# Patient Record
Sex: Female | Born: 1960
Health system: Southern US, Community
[De-identification: ages and names within clinical notes are randomized; demographics above are authoritative.]

## PROBLEM LIST (undated history)

## (undated) DIAGNOSIS — D649 Anemia, unspecified: Secondary | ICD-10-CM

## (undated) DIAGNOSIS — F909 Attention-deficit hyperactivity disorder, unspecified type: Secondary | ICD-10-CM

## (undated) DIAGNOSIS — F419 Anxiety disorder, unspecified: Secondary | ICD-10-CM

## (undated) DIAGNOSIS — F329 Major depressive disorder, single episode, unspecified: Secondary | ICD-10-CM

## (undated) DIAGNOSIS — N840 Polyp of corpus uteri: Secondary | ICD-10-CM

## (undated) DIAGNOSIS — G43909 Migraine, unspecified, not intractable, without status migrainosus: Secondary | ICD-10-CM

## (undated) DIAGNOSIS — F32A Depression, unspecified: Secondary | ICD-10-CM

## (undated) HISTORY — PX: ABDOMINAL HYSTERECTOMY: SHX81

## (undated) HISTORY — PX: UMBILICAL HERNIA REPAIR: SHX196

## (undated) HISTORY — PX: LAPAROSCOPIC VAGINAL HYSTERECTOMY WITH SALPINGO OOPHORECTOMY: SHX6681

---
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
## 1998-01-17 ENCOUNTER — Other Ambulatory Visit: Admission: RE | Admit: 1998-01-17 | Discharge: 1998-01-17 | Payer: Self-pay | Admitting: *Deleted

## 1998-09-30 ENCOUNTER — Encounter: Payer: Self-pay | Admitting: Emergency Medicine

## 1998-09-30 ENCOUNTER — Emergency Department (HOSPITAL_COMMUNITY): Admission: EM | Admit: 1998-09-30 | Discharge: 1998-09-30 | Payer: Self-pay | Admitting: Emergency Medicine

## 1999-01-20 ENCOUNTER — Other Ambulatory Visit: Admission: RE | Admit: 1999-01-20 | Discharge: 1999-01-20 | Payer: Self-pay | Admitting: *Deleted

## 1999-03-03 ENCOUNTER — Other Ambulatory Visit: Admission: RE | Admit: 1999-03-03 | Discharge: 1999-03-03 | Payer: Self-pay | Admitting: Obstetrics and Gynecology

## 1999-03-19 ENCOUNTER — Ambulatory Visit (HOSPITAL_COMMUNITY): Admission: RE | Admit: 1999-03-19 | Discharge: 1999-03-19 | Payer: Self-pay | Admitting: *Deleted

## 1999-03-31 ENCOUNTER — Other Ambulatory Visit: Admission: RE | Admit: 1999-03-31 | Discharge: 1999-03-31 | Payer: Self-pay | Admitting: Obstetrics and Gynecology

## 1999-03-31 ENCOUNTER — Encounter (INDEPENDENT_AMBULATORY_CARE_PROVIDER_SITE_OTHER): Payer: Self-pay | Admitting: Specialist

## 1999-10-06 ENCOUNTER — Other Ambulatory Visit: Admission: RE | Admit: 1999-10-06 | Discharge: 1999-10-06 | Payer: Self-pay | Admitting: Obstetrics and Gynecology

## 1999-10-07 ENCOUNTER — Encounter (INDEPENDENT_AMBULATORY_CARE_PROVIDER_SITE_OTHER): Payer: Self-pay

## 1999-10-07 ENCOUNTER — Other Ambulatory Visit: Admission: RE | Admit: 1999-10-07 | Discharge: 1999-10-07 | Payer: Self-pay | Admitting: Obstetrics and Gynecology

## 2000-01-08 ENCOUNTER — Encounter (INDEPENDENT_AMBULATORY_CARE_PROVIDER_SITE_OTHER): Payer: Self-pay | Admitting: Specialist

## 2000-01-08 ENCOUNTER — Ambulatory Visit (HOSPITAL_COMMUNITY): Admission: RE | Admit: 2000-01-08 | Discharge: 2000-01-08 | Payer: Self-pay | Admitting: Obstetrics and Gynecology

## 2000-06-03 ENCOUNTER — Other Ambulatory Visit: Admission: RE | Admit: 2000-06-03 | Discharge: 2000-06-03 | Payer: Self-pay | Admitting: Obstetrics and Gynecology

## 2000-06-09 ENCOUNTER — Encounter: Payer: Self-pay | Admitting: Obstetrics and Gynecology

## 2000-06-09 ENCOUNTER — Encounter: Admission: RE | Admit: 2000-06-09 | Discharge: 2000-06-09 | Payer: Self-pay | Admitting: Obstetrics and Gynecology

## 2001-03-10 ENCOUNTER — Other Ambulatory Visit: Admission: RE | Admit: 2001-03-10 | Discharge: 2001-03-10 | Payer: Self-pay | Admitting: Obstetrics and Gynecology

## 2001-04-14 ENCOUNTER — Ambulatory Visit (HOSPITAL_COMMUNITY): Admission: RE | Admit: 2001-04-14 | Discharge: 2001-04-14 | Payer: Self-pay | Admitting: Internal Medicine

## 2001-04-14 ENCOUNTER — Encounter: Payer: Self-pay | Admitting: Internal Medicine

## 2002-03-20 ENCOUNTER — Other Ambulatory Visit: Admission: RE | Admit: 2002-03-20 | Discharge: 2002-03-20 | Payer: Self-pay | Admitting: Obstetrics and Gynecology

## 2002-12-24 ENCOUNTER — Encounter: Payer: Self-pay | Admitting: Emergency Medicine

## 2002-12-24 ENCOUNTER — Emergency Department (HOSPITAL_COMMUNITY): Admission: EM | Admit: 2002-12-24 | Discharge: 2002-12-24 | Payer: Self-pay | Admitting: Emergency Medicine

## 2003-04-10 ENCOUNTER — Other Ambulatory Visit: Admission: RE | Admit: 2003-04-10 | Discharge: 2003-04-10 | Payer: Self-pay | Admitting: Obstetrics and Gynecology

## 2003-08-22 ENCOUNTER — Encounter: Admission: RE | Admit: 2003-08-22 | Discharge: 2003-08-22 | Payer: Self-pay | Admitting: Family Medicine

## 2004-04-04 ENCOUNTER — Inpatient Hospital Stay (HOSPITAL_COMMUNITY): Admission: AD | Admit: 2004-04-04 | Discharge: 2004-04-04 | Payer: Self-pay | Admitting: Obstetrics & Gynecology

## 2004-04-08 ENCOUNTER — Encounter: Admission: RE | Admit: 2004-04-08 | Discharge: 2004-04-08 | Payer: Self-pay | Admitting: Endocrinology

## 2004-05-20 ENCOUNTER — Encounter: Admission: RE | Admit: 2004-05-20 | Discharge: 2004-05-20 | Payer: Self-pay | Admitting: *Deleted

## 2004-08-01 ENCOUNTER — Encounter: Admission: RE | Admit: 2004-08-01 | Discharge: 2004-08-01 | Payer: Self-pay | Admitting: Obstetrics and Gynecology

## 2005-12-09 ENCOUNTER — Encounter: Admission: RE | Admit: 2005-12-09 | Discharge: 2005-12-09 | Payer: Self-pay | Admitting: Obstetrics and Gynecology

## 2005-12-20 ENCOUNTER — Emergency Department (HOSPITAL_COMMUNITY): Admission: EM | Admit: 2005-12-20 | Discharge: 2005-12-20 | Payer: Self-pay | Admitting: Emergency Medicine

## 2006-09-11 ENCOUNTER — Encounter: Admission: RE | Admit: 2006-09-11 | Discharge: 2006-09-11 | Payer: Self-pay

## 2007-01-06 ENCOUNTER — Encounter: Admission: RE | Admit: 2007-01-06 | Discharge: 2007-01-06 | Payer: Self-pay | Admitting: *Deleted

## 2007-06-20 ENCOUNTER — Encounter: Admission: RE | Admit: 2007-06-20 | Discharge: 2007-06-20 | Payer: Self-pay | Admitting: Obstetrics and Gynecology

## 2008-02-13 ENCOUNTER — Emergency Department (HOSPITAL_COMMUNITY): Admission: EM | Admit: 2008-02-13 | Discharge: 2008-02-13 | Payer: Self-pay | Admitting: Emergency Medicine

## 2008-06-07 ENCOUNTER — Encounter: Admission: RE | Admit: 2008-06-07 | Discharge: 2008-06-07 | Payer: Self-pay | Admitting: Obstetrics and Gynecology

## 2009-04-30 ENCOUNTER — Encounter: Admission: RE | Admit: 2009-04-30 | Discharge: 2009-04-30 | Payer: Self-pay | Admitting: Obstetrics

## 2009-08-21 ENCOUNTER — Encounter: Admission: RE | Admit: 2009-08-21 | Discharge: 2009-08-21 | Payer: Self-pay | Admitting: Obstetrics

## 2010-05-06 ENCOUNTER — Ambulatory Visit: Payer: Self-pay | Admitting: Genetic Counselor

## 2010-08-24 ENCOUNTER — Encounter: Payer: Self-pay | Admitting: Obstetrics and Gynecology

## 2010-08-24 ENCOUNTER — Encounter: Payer: Self-pay | Admitting: *Deleted

## 2010-08-29 ENCOUNTER — Encounter
Admission: RE | Admit: 2010-08-29 | Discharge: 2010-08-29 | Payer: Self-pay | Source: Home / Self Care | Attending: Obstetrics | Admitting: Obstetrics

## 2010-12-19 NOTE — Op Note (Signed)
[facility]  Patient:    Molly Brady, Molly Brady                         MRN: [id number] Proc. Date: 01/08/00 Adm. Date:  [id number] Disc. Date: [id number] Attending:  Cordelia Pen Ii                           Operative Report  PREOPERATIVE DIAGNOSIS:  Abnormal uterine bleeding.  POSTOPERATIVE DIAGNOSIS:  Abnormal uterine bleeding.  PROCEDURE:  Hysteroscopy with dilatation and curettage.  ANESTHESIA:  MAC with 1% Xylocaine paracervical block.  ESTIMATED BLOOD LOSS:  Less than 50 cc.  INTAKE AND OUTPUT:  Sorbitol distending media 100 cc deficit with some of that on the floor.  INDICATIONS AND CONSENT:  The patient is a 50 year old divorced white female G1, P1, with increasingly heavy menses.  Sonohysterogram was suspicious for endometrial polyp.  Hysteroscopy and D&C is discussed with the patient. Possible risks and complications are discussed, including but not limited to infection, uterine perforation, organ damage, bleeding requiring transfusion of blood products with possible transfusion reaction, HIV, and hepatitis acquisition, DVT, PE, pneumonia, and hysterectomy.  All questions are answered and consent is signed on the chart.  DESCRIPTION OF PROCEDURE:  The patient was taken to the operating room and placed in the dorsal supine position, where she was given intravenous sedation.  She was then placed in the dorsal lithotomy position, where she was gently prepped, bladder straight catheterized in a straight sterile fashion. Bivalve speculum was placed in the vagina.  The anterior cervical lip is injected with 1% Xylocaine and then grasped with a single-tooth tenaculum. Paracervical block is then placed at 2, 4, 5, 7, 8, and 10 oclock positions with 1% Xylocaine.  The cervix is gently progressively dilated with Shawnie Pons dilators.  There is a sharp angle from the endocervix into the endometrial cavity.  The endocervix is dilated to a 33 Pratt dilator,  and the diagnostic hysteroscope is still unable to be placed through the endocervix into the endometrial cavity safely.  This is done under direct visualization using Sorbitol distending media.  At this point, it is felt that the hysteroscope could not be safely placed in the endometrial cavity.  Therefore, sharp curettage is done.  This is productive of obvious endometrial tissue.  Sharp curettage is then carried out until the cavity feels clean.  The cavity is explored with Randall stone forceps.  The single-tooth tenaculum is removed, and good hemostasis is noted.  All instruments are removed, and all counts are correct, and the patient transferred to the recovery room in stable condition. DD:  01/08/00 TD:  01/12/00 Job: [id number]

## 2011-11-10 ENCOUNTER — Other Ambulatory Visit: Payer: Self-pay | Admitting: Obstetrics

## 2011-11-10 DIAGNOSIS — Z1231 Encounter for screening mammogram for malignant neoplasm of breast: Secondary | ICD-10-CM

## 2011-11-20 ENCOUNTER — Ambulatory Visit
Admission: RE | Admit: 2011-11-20 | Discharge: 2011-11-20 | Disposition: A | Discharge status: Home / Self Care | Payer: BC Managed Care – PPO | Source: Ambulatory Visit | Attending: Obstetrics | Admitting: Obstetrics

## 2011-11-20 DIAGNOSIS — Z1231 Encounter for screening mammogram for malignant neoplasm of breast: Secondary | ICD-10-CM

## 2013-02-07 ENCOUNTER — Other Ambulatory Visit: Payer: Self-pay

## 2013-02-07 DIAGNOSIS — Z1231 Encounter for screening mammogram for malignant neoplasm of breast: Secondary | ICD-10-CM

## 2013-03-02 ENCOUNTER — Ambulatory Visit
Admission: RE | Admit: 2013-03-02 | Discharge: 2013-03-02 | Disposition: A | Discharge status: Home / Self Care | Payer: Managed Care, Other (non HMO) | Source: Ambulatory Visit

## 2013-03-02 DIAGNOSIS — Z1231 Encounter for screening mammogram for malignant neoplasm of breast: Secondary | ICD-10-CM

## 2014-03-28 ENCOUNTER — Other Ambulatory Visit: Payer: Self-pay

## 2014-03-28 DIAGNOSIS — Z1231 Encounter for screening mammogram for malignant neoplasm of breast: Secondary | ICD-10-CM

## 2014-04-12 ENCOUNTER — Ambulatory Visit
Admission: RE | Admit: 2014-04-12 | Discharge: 2014-04-12 | Disposition: A | Discharge status: Home / Self Care | Payer: 59 | Source: Ambulatory Visit

## 2014-04-12 DIAGNOSIS — Z1231 Encounter for screening mammogram for malignant neoplasm of breast: Secondary | ICD-10-CM

## 2015-04-04 ENCOUNTER — Other Ambulatory Visit: Payer: Self-pay

## 2015-04-04 DIAGNOSIS — Z1231 Encounter for screening mammogram for malignant neoplasm of breast: Secondary | ICD-10-CM

## 2015-04-17 ENCOUNTER — Ambulatory Visit
Admission: RE | Admit: 2015-04-17 | Discharge: 2015-04-17 | Disposition: A | Discharge status: Home / Self Care | Payer: 59 | Source: Ambulatory Visit

## 2015-04-17 DIAGNOSIS — Z1231 Encounter for screening mammogram for malignant neoplasm of breast: Secondary | ICD-10-CM

## 2016-04-01 ENCOUNTER — Other Ambulatory Visit: Payer: Self-pay | Admitting: Obstetrics

## 2016-04-01 DIAGNOSIS — Z1231 Encounter for screening mammogram for malignant neoplasm of breast: Secondary | ICD-10-CM

## 2016-04-20 ENCOUNTER — Ambulatory Visit
Admission: RE | Admit: 2016-04-20 | Discharge: 2016-04-20 | Disposition: A | Discharge status: Home / Self Care | Payer: 59 | Source: Ambulatory Visit | Attending: Obstetrics | Admitting: Obstetrics

## 2016-04-20 DIAGNOSIS — Z1231 Encounter for screening mammogram for malignant neoplasm of breast: Secondary | ICD-10-CM

## 2016-04-23 ENCOUNTER — Other Ambulatory Visit: Payer: Self-pay | Admitting: Obstetrics

## 2016-04-23 DIAGNOSIS — R928 Other abnormal and inconclusive findings on diagnostic imaging of breast: Secondary | ICD-10-CM

## 2016-04-27 ENCOUNTER — Ambulatory Visit
Admission: RE | Admit: 2016-04-27 | Discharge: 2016-04-27 | Disposition: A | Discharge status: Home / Self Care | Payer: 59 | Source: Ambulatory Visit | Attending: Obstetrics | Admitting: Obstetrics

## 2016-04-27 ENCOUNTER — Other Ambulatory Visit: Payer: Self-pay | Admitting: Obstetrics

## 2016-04-27 DIAGNOSIS — N6002 Solitary cyst of left breast: Secondary | ICD-10-CM

## 2016-04-27 DIAGNOSIS — R928 Other abnormal and inconclusive findings on diagnostic imaging of breast: Secondary | ICD-10-CM

## 2016-04-28 ENCOUNTER — Other Ambulatory Visit: Payer: 59

## 2016-04-29 ENCOUNTER — Ambulatory Visit
Admission: RE | Admit: 2016-04-29 | Discharge: 2016-04-29 | Disposition: A | Discharge status: Home / Self Care | Payer: 59 | Source: Ambulatory Visit | Attending: Obstetrics | Admitting: Obstetrics

## 2016-04-29 ENCOUNTER — Other Ambulatory Visit: Payer: Self-pay | Admitting: Obstetrics

## 2016-04-29 DIAGNOSIS — N6002 Solitary cyst of left breast: Secondary | ICD-10-CM

## 2016-08-13 DIAGNOSIS — D352 Benign neoplasm of pituitary gland: Secondary | ICD-10-CM | POA: Diagnosis not present

## 2016-12-20 DIAGNOSIS — H2000 Unspecified acute and subacute iridocyclitis: Secondary | ICD-10-CM | POA: Diagnosis not present

## 2016-12-20 DIAGNOSIS — H1820 Unspecified corneal edema: Secondary | ICD-10-CM | POA: Diagnosis not present

## 2016-12-20 DIAGNOSIS — H18821 Corneal disorder due to contact lens, right eye: Secondary | ICD-10-CM | POA: Diagnosis not present

## 2016-12-21 DIAGNOSIS — H2000 Unspecified acute and subacute iridocyclitis: Secondary | ICD-10-CM | POA: Diagnosis not present

## 2016-12-23 DIAGNOSIS — Z6823 Body mass index (BMI) 23.0-23.9, adult: Secondary | ICD-10-CM | POA: Diagnosis not present

## 2016-12-23 DIAGNOSIS — Z01419 Encounter for gynecological examination (general) (routine) without abnormal findings: Secondary | ICD-10-CM | POA: Diagnosis not present

## 2016-12-24 DIAGNOSIS — H2000 Unspecified acute and subacute iridocyclitis: Secondary | ICD-10-CM | POA: Diagnosis not present

## 2017-01-07 DIAGNOSIS — H2000 Unspecified acute and subacute iridocyclitis: Secondary | ICD-10-CM | POA: Diagnosis not present

## 2017-01-29 DIAGNOSIS — Z23 Encounter for immunization: Secondary | ICD-10-CM | POA: Diagnosis not present

## 2017-08-03 DIAGNOSIS — R748 Abnormal levels of other serum enzymes: Secondary | ICD-10-CM

## 2017-08-03 HISTORY — DX: Abnormal levels of other serum enzymes: R74.8

## 2017-09-13 DIAGNOSIS — J019 Acute sinusitis, unspecified: Secondary | ICD-10-CM | POA: Diagnosis not present

## 2017-09-15 ENCOUNTER — Encounter: Payer: Self-pay | Admitting: Internal Medicine

## 2017-10-26 ENCOUNTER — Other Ambulatory Visit: Payer: Self-pay | Admitting: Internal Medicine

## 2017-10-26 DIAGNOSIS — Z1321 Encounter for screening for nutritional disorder: Secondary | ICD-10-CM

## 2017-10-26 DIAGNOSIS — Z1329 Encounter for screening for other suspected endocrine disorder: Secondary | ICD-10-CM

## 2017-10-26 DIAGNOSIS — Z13220 Encounter for screening for lipoid disorders: Secondary | ICD-10-CM

## 2017-10-26 DIAGNOSIS — Z0000 Encounter for general adult medical examination without abnormal findings: Secondary | ICD-10-CM

## 2017-11-02 ENCOUNTER — Other Ambulatory Visit: Payer: 59 | Admitting: Internal Medicine

## 2017-11-02 DIAGNOSIS — Z1321 Encounter for screening for nutritional disorder: Secondary | ICD-10-CM | POA: Diagnosis not present

## 2017-11-02 DIAGNOSIS — Z1329 Encounter for screening for other suspected endocrine disorder: Secondary | ICD-10-CM | POA: Diagnosis not present

## 2017-11-02 DIAGNOSIS — Z0000 Encounter for general adult medical examination without abnormal findings: Secondary | ICD-10-CM

## 2017-11-02 DIAGNOSIS — Z13220 Encounter for screening for lipoid disorders: Secondary | ICD-10-CM

## 2017-11-03 LAB — CBC WITH DIFFERENTIAL/PLATELET
BASOS ABS: 39 {cells}/uL (ref 0–200)
BASOS PCT: 0.8 %
Eosinophils Absolute: 39 cells/uL (ref 15–500)
Eosinophils Relative: 0.8 %
HEMATOCRIT: 39.5 % (ref 35.0–45.0)
HEMOGLOBIN: 13.3 g/dL (ref 11.7–15.5)
LYMPHS ABS: 2092 {cells}/uL (ref 850–3900)
MCH: 30.4 pg (ref 27.0–33.0)
MCHC: 33.7 g/dL (ref 32.0–36.0)
MCV: 90.4 fL (ref 80.0–100.0)
MPV: 9.4 fL (ref 7.5–12.5)
Monocytes Relative: 5.9 %
NEUTROS ABS: 2440 {cells}/uL (ref 1500–7800)
Neutrophils Relative %: 49.8 %
Platelets: 304 10*3/uL (ref 140–400)
RBC: 4.37 10*6/uL (ref 3.80–5.10)
RDW: 11.9 % (ref 11.0–15.0)
Total Lymphocyte: 42.7 %
WBC mixed population: 289 cells/uL (ref 200–950)
WBC: 4.9 10*3/uL (ref 3.8–10.8)

## 2017-11-03 LAB — COMPLETE METABOLIC PANEL WITH GFR
AG Ratio: 1.4 (calc) (ref 1.0–2.5)
ALT: 85 U/L — AB (ref 6–29)
AST: 59 U/L — AB (ref 10–35)
Albumin: 4.5 g/dL (ref 3.6–5.1)
Alkaline phosphatase (APISO): 181 U/L — ABNORMAL HIGH (ref 33–130)
BUN: 8 mg/dL (ref 7–25)
CALCIUM: 9.9 mg/dL (ref 8.6–10.4)
CHLORIDE: 101 mmol/L (ref 98–110)
CO2: 29 mmol/L (ref 20–32)
Creat: 0.75 mg/dL (ref 0.50–1.05)
GFR, EST AFRICAN AMERICAN: 104 mL/min/{1.73_m2} (ref >=60)
GFR, EST NON AFRICAN AMERICAN: 90 mL/min/{1.73_m2} (ref >=60)
GLUCOSE: 90 mg/dL (ref 65–99)
Globulin: 3.2 g/dL (calc) (ref 1.9–3.7)
Potassium: 4.3 mmol/L (ref 3.5–5.3)
Sodium: 140 mmol/L (ref 135–146)
Total Bilirubin: 0.4 mg/dL (ref 0.2–1.2)
Total Protein: 7.7 g/dL (ref 6.1–8.1)

## 2017-11-03 LAB — LIPID PANEL
CHOL/HDL RATIO: 3.3 (calc) (ref <5.0)
Cholesterol: 222 mg/dL — ABNORMAL HIGH (ref <200)
HDL: 68 mg/dL (ref >50)
LDL Cholesterol (Calc): 130 mg/dL (calc) — ABNORMAL HIGH
NON-HDL CHOLESTEROL (CALC): 154 mg/dL — AB (ref <130)
Triglycerides: 126 mg/dL (ref <150)

## 2017-11-03 LAB — VITAMIN D 25 HYDROXY (VIT D DEFICIENCY, FRACTURES): Vit D, 25-Hydroxy: 45 ng/mL (ref 30–100)

## 2017-11-03 LAB — TSH: TSH: 1 mIU/L

## 2017-11-09 ENCOUNTER — Ambulatory Visit (INDEPENDENT_AMBULATORY_CARE_PROVIDER_SITE_OTHER): Payer: 59 | Admitting: Internal Medicine

## 2017-11-09 ENCOUNTER — Encounter: Payer: Self-pay | Admitting: Internal Medicine

## 2017-11-09 VITALS — BP 120/90 | HR 90 | Ht 64.0 in | Wt 138.0 lb

## 2017-11-09 DIAGNOSIS — R748 Abnormal levels of other serum enzymes: Secondary | ICD-10-CM

## 2017-11-09 DIAGNOSIS — Z0000 Encounter for general adult medical examination without abnormal findings: Secondary | ICD-10-CM | POA: Diagnosis not present

## 2017-11-09 DIAGNOSIS — F329 Major depressive disorder, single episode, unspecified: Secondary | ICD-10-CM

## 2017-11-09 DIAGNOSIS — Z8669 Personal history of other diseases of the nervous system and sense organs: Secondary | ICD-10-CM

## 2017-11-09 DIAGNOSIS — E7800 Pure hypercholesterolemia, unspecified: Secondary | ICD-10-CM | POA: Diagnosis not present

## 2017-11-09 DIAGNOSIS — F900 Attention-deficit hyperactivity disorder, predominantly inattentive type: Secondary | ICD-10-CM | POA: Diagnosis not present

## 2017-11-09 DIAGNOSIS — F32A Depression, unspecified: Secondary | ICD-10-CM

## 2017-11-09 DIAGNOSIS — F419 Anxiety disorder, unspecified: Secondary | ICD-10-CM | POA: Diagnosis not present

## 2017-11-09 LAB — POCT URINALYSIS DIPSTICK
APPEARANCE: NORMAL
Bilirubin, UA: NEGATIVE
Blood, UA: NEGATIVE
Glucose, UA: NEGATIVE
Ketones, UA: NEGATIVE
Leukocytes, UA: NEGATIVE
NITRITE UA: NEGATIVE
Odor: NORMAL
PH UA: 6 (ref 5.0–8.0)
PROTEIN UA: NEGATIVE
Spec Grav, UA: 1.015 (ref 1.010–1.025)
UROBILINOGEN UA: 0.2 U/dL

## 2017-11-09 NOTE — Progress Notes (Signed)
   Subjective:    Patient ID: Molly Brady, female    DOB: Jun 19, 1961, 57 y.o.   MRN: [id number]  HPI First visit for this pleasant 57 year old White Female for health maintenance exam.  She sees Dr. Valentino Saxon for annual GYN exam.  She sees Dr. Lynder Parents for attention deficit disorder, anxiety, and depression.  Had umbilical hernia repair in 1963.  Right arm fracture twice in childhood.  Fractured foot from a fall downstairs and fractured ankle twice.  One pregnancy.  History of migraine headaches treated with Maxalt.  Patient takes Vyvanse, Adderall, gabapentin, Paxil and Xanax.  Social history: She is an Research scientist (life sciences) self-employed.  She is divorced.  Does not smoke or consume alcohol.  Family history: Father died of lung cancer at age 53.  Mother died of lung cancer at age 40.  2 sisters in good health.  Her daughter age 45 in good health.  One brother in good health.   Review of Systems menopausal at age 54,struggles with anxiety and depression     Objective:   Physical Exam  Constitutional: She is oriented to person, place, and time. She appears well-developed and well-nourished. No distress.  HENT:  Head: Normocephalic and atraumatic.  Right Ear: External ear normal.  Left Ear: External ear normal.  Mouth/Throat: Oropharynx is clear and moist.  Eyes: Pupils are equal, round, and reactive to light. Conjunctivae and EOM are normal. Right eye exhibits no discharge. Left eye exhibits no discharge. No scleral icterus.  Cardiovascular: Normal rate, regular rhythm and normal heart sounds.  Musculoskeletal: She exhibits no edema.  Neurological: She is oriented to person, place, and time. She has normal reflexes. No cranial nerve deficit. Coordination normal.  Skin: She is not diaphoretic.  Vitals reviewed.         Assessment & Plan:  Review of lab work shows elevated liver functions.  She does not consume alcohol.  No recent illness to explain it.  Alkaline phosphatase is  181, SGOT 59 and SGPT 85.  We have no previous liver functions to compare at this time so we are going to order ultrasound of liver and gallbladder for further evaluation.  Hyperlipidemia-total cholesterol was 222 with an LDL cholesterol of 130.  Recommend diet and exercise and follow-up in 6 months.  HDL is 68 and triglycerides are 126.

## 2017-11-11 ENCOUNTER — Telehealth: Payer: Self-pay | Admitting: Internal Medicine

## 2017-11-11 DIAGNOSIS — R748 Abnormal levels of other serum enzymes: Secondary | ICD-10-CM

## 2017-11-11 NOTE — Telephone Encounter (Signed)
Cancel liver ulrasound with doppler. Just need liver ultrasound due to elevated liver enzymes.

## 2017-11-16 ENCOUNTER — Telehealth: Payer: Self-pay | Admitting: Internal Medicine

## 2017-11-16 ENCOUNTER — Encounter: Payer: Self-pay | Admitting: Internal Medicine

## 2017-11-16 NOTE — Telephone Encounter (Signed)
Ultrasound of abdomen orderd for elevated LFTs

## 2017-11-19 ENCOUNTER — Ambulatory Visit
Admission: RE | Admit: 2017-11-19 | Discharge: 2017-11-19 | Disposition: A | Discharge status: Home / Self Care | Payer: 59 | Source: Ambulatory Visit | Attending: Internal Medicine | Admitting: Internal Medicine

## 2017-11-19 DIAGNOSIS — R7989 Other specified abnormal findings of blood chemistry: Secondary | ICD-10-CM | POA: Diagnosis not present

## 2017-11-19 DIAGNOSIS — R748 Abnormal levels of other serum enzymes: Secondary | ICD-10-CM

## 2017-11-23 ENCOUNTER — Telehealth: Payer: Self-pay | Admitting: Internal Medicine

## 2017-11-23 DIAGNOSIS — N949 Unspecified condition associated with female genital organs and menstrual cycle: Secondary | ICD-10-CM

## 2017-11-23 DIAGNOSIS — K760 Fatty (change of) liver, not elsewhere classified: Secondary | ICD-10-CM

## 2017-11-23 NOTE — Telephone Encounter (Signed)
Pt called about need for OV later this week. Had wanted to discuss results of abdominal ultrasound with her in office. Ultrasound was negative for gallstones. However, radiologist mentioned possible pelvic cyst as he noted dilated renal pelvis. This is not likely related to elevated LFTs. Still want to discuss this with her in office but have ordered CT of abdomen and pelvis with contrast for further evaluation.

## 2017-11-25 ENCOUNTER — Ambulatory Visit (INDEPENDENT_AMBULATORY_CARE_PROVIDER_SITE_OTHER): Payer: 59 | Admitting: Internal Medicine

## 2017-11-25 ENCOUNTER — Encounter: Payer: Self-pay | Admitting: Internal Medicine

## 2017-11-25 VITALS — BP 110/60 | HR 80 | Ht 64.0 in | Wt 136.0 lb

## 2017-11-25 DIAGNOSIS — R7989 Other specified abnormal findings of blood chemistry: Secondary | ICD-10-CM

## 2017-11-25 DIAGNOSIS — R945 Abnormal results of liver function studies: Secondary | ICD-10-CM | POA: Diagnosis not present

## 2017-11-25 LAB — HEPATIC FUNCTION PANEL
AG RATIO: 1.4 (calc) (ref 1.0–2.5)
ALBUMIN MSPROF: 4.1 g/dL (ref 3.6–5.1)
ALT: 43 U/L — AB (ref 6–29)
AST: 33 U/L (ref 10–35)
Alkaline phosphatase (APISO): 163 U/L — ABNORMAL HIGH (ref 33–130)
BILIRUBIN DIRECT: 0.1 mg/dL (ref 0.0–0.2)
BILIRUBIN TOTAL: 0.2 mg/dL (ref 0.2–1.2)
GLOBULIN: 3 g/dL (ref 1.9–3.7)
Indirect Bilirubin: 0.1 mg/dL (calc) — ABNORMAL LOW (ref 0.2–1.2)
Total Protein: 7.1 g/dL (ref 6.1–8.1)

## 2017-11-25 NOTE — Progress Notes (Signed)
   Subjective:    Patient ID: Molly Brady, female    DOB: 27-Apr-1961, 57 y.o.   MRN: [id number]  HPI 57 year old Female was seen for the first time here at this office on April 9.  We had no previous lab work available to [place].  We found that she had alkaline phosphatase of 181, SGOT of 59 and SGPT of 85.  She denies alcohol consumption or use of chronic pain medication although she used to take ibuprofen regularly in the remote past.  She has not had a recent viral syndrome.  She has no abdominal pain nausea or vomiting.  I recommended that she have ultrasound of the upper abdomen to see if she had gallbladder disease.  This result was negative for cholelithiasis.  However the question was raised regarding a possible parapelvic cyst and/or hydronephrosis.  A CT of the abdomen and pelvis has been ordered and is pending.  We discussed all this with her over the telephone recently because she was anxious about the results of the ultrasound.  She seems calm today.  We spoke about these issues above and she agrees to have CT.  Will be like to repeat her liver panel today.    Review of Systems see above     Objective:   Physical Exam She was not examined but spent 15 minutes speaking with her about these issues.  She does not have a SGOT/SGPT split.  She has no abdominal pain to explain these abnormalities.       Assessment & Plan:  Elevated alkaline phosphatase  Elevated SGOT   Elevated SGPT  Plan: Liver panel repeated and results are pending.  There is a question that she may have a  parapelvic cyst.  She is going to have a CT of the abdomen and pelvis in the near future.

## 2017-11-25 NOTE — Patient Instructions (Signed)
Liver panel repeated today.  CT approved.

## 2017-11-26 ENCOUNTER — Ambulatory Visit
Admission: RE | Admit: 2017-11-26 | Discharge: 2017-11-26 | Disposition: A | Discharge status: Home / Self Care | Payer: 59 | Source: Ambulatory Visit | Attending: Internal Medicine | Admitting: Internal Medicine

## 2017-11-26 DIAGNOSIS — N949 Unspecified condition associated with female genital organs and menstrual cycle: Secondary | ICD-10-CM | POA: Diagnosis not present

## 2017-11-26 MED ORDER — IOPAMIDOL (ISOVUE-300) INJECTION 61%
100.0000 mL | Freq: Once | INTRAVENOUS | Status: AC | PRN
  Administered 2017-11-26: 100 mL via INTRAVENOUS

## 2017-11-27 DIAGNOSIS — F419 Anxiety disorder, unspecified: Secondary | ICD-10-CM | POA: Insufficient documentation

## 2017-11-27 DIAGNOSIS — Z8669 Personal history of other diseases of the nervous system and sense organs: Secondary | ICD-10-CM | POA: Insufficient documentation

## 2017-11-27 DIAGNOSIS — F32A Depression, unspecified: Secondary | ICD-10-CM | POA: Insufficient documentation

## 2017-11-27 DIAGNOSIS — F329 Major depressive disorder, single episode, unspecified: Secondary | ICD-10-CM | POA: Insufficient documentation

## 2017-11-27 DIAGNOSIS — F988 Other specified behavioral and emotional disorders with onset usually occurring in childhood and adolescence: Secondary | ICD-10-CM | POA: Insufficient documentation

## 2017-11-27 DIAGNOSIS — R748 Abnormal levels of other serum enzymes: Secondary | ICD-10-CM | POA: Insufficient documentation

## 2017-11-27 NOTE — Patient Instructions (Signed)
Ultrasound of gallbladder liver and pancreas because of elevated liver enzymes with no previous database to compare.  Work on diet and exercise due to hypercholesterolemia.  Recommend repeat lipid panel in 6 months.

## 2017-12-07 ENCOUNTER — Ambulatory Visit: Payer: 59 | Admitting: Internal Medicine

## 2017-12-10 ENCOUNTER — Other Ambulatory Visit: Payer: Self-pay

## 2017-12-10 DIAGNOSIS — R945 Abnormal results of liver function studies: Principal | ICD-10-CM

## 2017-12-10 DIAGNOSIS — R7989 Other specified abnormal findings of blood chemistry: Secondary | ICD-10-CM

## 2017-12-28 ENCOUNTER — Other Ambulatory Visit: Payer: 59 | Admitting: Internal Medicine

## 2017-12-28 DIAGNOSIS — R7989 Other specified abnormal findings of blood chemistry: Secondary | ICD-10-CM

## 2017-12-28 DIAGNOSIS — R945 Abnormal results of liver function studies: Secondary | ICD-10-CM | POA: Diagnosis not present

## 2017-12-28 LAB — HEPATIC FUNCTION PANEL
AG Ratio: 1.5 (calc) (ref 1.0–2.5)
ALBUMIN MSPROF: 4.4 g/dL (ref 3.6–5.1)
ALT: 21 U/L (ref 6–29)
AST: 22 U/L (ref 10–35)
Alkaline phosphatase (APISO): 117 U/L (ref 33–130)
BILIRUBIN DIRECT: 0.1 mg/dL (ref 0.0–0.2)
Globulin: 2.9 g/dL (calc) (ref 1.9–3.7)
Indirect Bilirubin: 0.3 mg/dL (calc) (ref 0.2–1.2)
TOTAL PROTEIN: 7.3 g/dL (ref 6.1–8.1)
Total Bilirubin: 0.4 mg/dL (ref 0.2–1.2)

## 2017-12-29 ENCOUNTER — Telehealth: Payer: Self-pay | Admitting: Internal Medicine

## 2017-12-29 NOTE — Telephone Encounter (Signed)
I think the liver functions should be repeated in 3 months. OK to cancel tomorrow's appt but want to see her in 3 months.

## 2017-12-29 NOTE — Telephone Encounter (Signed)
She has an appointment with you tomorrow for 1 month recheck at 4:00 p.m.  She saw her lab results on My Chart and sees that they are normal.  She usually sleeps til midnight and gets up to go into work at 1:00 a.m.  So, she is sorry that she made that appointment for 4pm.  And, if she doesn't really need to come back in at 4pm since the labs are normal, she would rather not do so.  If, however, she does need to be seen in follow up, she'll go ahead and keep the appointment tomorrow.  She just wanted to check since she saw the lab results in My Chart.    Can you advise?  Thank you.    [phone]

## 2017-12-30 ENCOUNTER — Ambulatory Visit: Payer: 59 | Admitting: Internal Medicine

## 2017-12-30 NOTE — Telephone Encounter (Signed)
Spoke with patient this morning and cancelled appointment for today.  Advised that Dr. Renold Genta wants her to return for repeat Lipid Panel and 3 month recheck visit.  R/S for 8/12 fasting labs at 9:00 a.m. And 9:45 3 month visit on 8/19.  Patient confirmed both of these visits.  She is aware that she will receive reminder calls for these visits as well.

## 2018-01-07 ENCOUNTER — Other Ambulatory Visit: Payer: Self-pay | Admitting: Obstetrics

## 2018-01-07 DIAGNOSIS — Z1231 Encounter for screening mammogram for malignant neoplasm of breast: Secondary | ICD-10-CM

## 2018-01-11 ENCOUNTER — Ambulatory Visit: Payer: 59 | Admitting: Internal Medicine

## 2018-01-25 DIAGNOSIS — Z86010 Personal history of colonic polyps: Secondary | ICD-10-CM | POA: Diagnosis not present

## 2018-01-25 DIAGNOSIS — Z1211 Encounter for screening for malignant neoplasm of colon: Secondary | ICD-10-CM | POA: Diagnosis not present

## 2018-01-28 ENCOUNTER — Ambulatory Visit
Admission: RE | Admit: 2018-01-28 | Discharge: 2018-01-28 | Disposition: A | Discharge status: Home / Self Care | Payer: 59 | Source: Ambulatory Visit | Attending: Obstetrics | Admitting: Obstetrics

## 2018-01-28 DIAGNOSIS — Z1231 Encounter for screening mammogram for malignant neoplasm of breast: Secondary | ICD-10-CM | POA: Diagnosis not present

## 2018-02-24 DIAGNOSIS — Z6824 Body mass index (BMI) 24.0-24.9, adult: Secondary | ICD-10-CM | POA: Diagnosis not present

## 2018-02-24 DIAGNOSIS — Z01419 Encounter for gynecological examination (general) (routine) without abnormal findings: Secondary | ICD-10-CM | POA: Diagnosis not present

## 2018-03-02 DIAGNOSIS — J0110 Acute frontal sinusitis, unspecified: Secondary | ICD-10-CM | POA: Diagnosis not present

## 2018-03-07 ENCOUNTER — Other Ambulatory Visit: Payer: Self-pay | Admitting: Internal Medicine

## 2018-03-07 ENCOUNTER — Encounter: Payer: Self-pay | Admitting: Internal Medicine

## 2018-03-07 DIAGNOSIS — E7800 Pure hypercholesterolemia, unspecified: Secondary | ICD-10-CM

## 2018-03-07 DIAGNOSIS — Z1211 Encounter for screening for malignant neoplasm of colon: Secondary | ICD-10-CM | POA: Diagnosis not present

## 2018-03-07 DIAGNOSIS — K635 Polyp of colon: Secondary | ICD-10-CM | POA: Diagnosis not present

## 2018-03-07 DIAGNOSIS — D120 Benign neoplasm of cecum: Secondary | ICD-10-CM | POA: Diagnosis not present

## 2018-03-08 ENCOUNTER — Other Ambulatory Visit: Payer: 59 | Admitting: Internal Medicine

## 2018-03-08 DIAGNOSIS — E7800 Pure hypercholesterolemia, unspecified: Secondary | ICD-10-CM

## 2018-03-08 LAB — LIPID PANEL
CHOLESTEROL: 193 mg/dL (ref <200)
HDL: 60 mg/dL (ref >50)
LDL CHOLESTEROL (CALC): 110 mg/dL — AB
Non-HDL Cholesterol (Calc): 133 mg/dL (calc) — ABNORMAL HIGH (ref <130)
TRIGLYCERIDES: 120 mg/dL (ref <150)
Total CHOL/HDL Ratio: 3.2 (calc) (ref <5.0)

## 2018-03-10 ENCOUNTER — Ambulatory Visit (INDEPENDENT_AMBULATORY_CARE_PROVIDER_SITE_OTHER): Payer: 59 | Admitting: Internal Medicine

## 2018-03-10 VITALS — BP 130/88 | HR 88 | Ht 64.0 in | Wt 141.0 lb

## 2018-03-10 DIAGNOSIS — E7800 Pure hypercholesterolemia, unspecified: Secondary | ICD-10-CM

## 2018-03-14 ENCOUNTER — Other Ambulatory Visit: Payer: 59 | Admitting: Internal Medicine

## 2018-03-21 ENCOUNTER — Ambulatory Visit: Payer: 59 | Admitting: Internal Medicine

## 2018-04-02 ENCOUNTER — Encounter: Payer: Self-pay | Admitting: Internal Medicine

## 2018-04-02 NOTE — Progress Notes (Signed)
   Subjective:    Patient ID: Molly Brady, female    DOB: 1961-07-01, 57 y.o.   MRN: [id number]  HPI patient has a history of attention deficit disorder, anxiety and depression.  She is on Vyvanse, Adderall, gabapentin, Paxil and Xanax.  All of these are metabolized through the liver.  She was here in April and had elevated liver functions.  She does not consume alcohol.  At that time alkaline phosphatase was 181, SGOT 59 and SGPT 85.  She also had hyperlipidemia with total cholesterol 222, LDL cholesterol of 130.  Diet exercise was recommended.  Triglycerides were normal at 126.  Liver ultrasound in April was negative for gallstones.  She also had a mild fullness of the right renal pelvis but renal functions were normal.  She elected to proceed this with CT and fortunately no abnormality was seen in the abdomen or pelvis.  Patient is feeling well without complaints.  Her liver functions were repeated in late May and have returned to normal.  She had colonoscopy by Dr. Collene Mares in August and tubular adenoma was removed.  We have rechecked her lipid panel.  Total cholesterol has improved from 222 to 193.  LDL cholesterol has improved from 130 to 110, which is very reassuring.  Liver functions were not checked with this visit    Review of Systems     Objective:   Physical Exam  Not examined but spent 15 minutes speaking with her about these issues.  She is feeling well and no further work-up will be pursued at this time.  She will be due for physical exam in the Spring.      Assessment & Plan:  Elevated liver functions-resolved  Mild elevation of LDL  Plan: Physical exam due April 2020

## 2018-04-02 NOTE — Patient Instructions (Addendum)
Please continue to work on diet and exercise and follow-up in April 2020.

## 2018-05-25 DIAGNOSIS — L570 Actinic keratosis: Secondary | ICD-10-CM | POA: Diagnosis not present

## 2018-05-25 DIAGNOSIS — L821 Other seborrheic keratosis: Secondary | ICD-10-CM | POA: Diagnosis not present

## 2018-05-25 DIAGNOSIS — L814 Other melanin hyperpigmentation: Secondary | ICD-10-CM | POA: Diagnosis not present

## 2018-06-09 DIAGNOSIS — M79641 Pain in right hand: Secondary | ICD-10-CM | POA: Diagnosis not present

## 2018-06-09 DIAGNOSIS — M25562 Pain in left knee: Secondary | ICD-10-CM | POA: Diagnosis not present

## 2018-06-17 DIAGNOSIS — M25562 Pain in left knee: Secondary | ICD-10-CM | POA: Diagnosis not present

## 2018-06-24 ENCOUNTER — Encounter: Payer: Self-pay | Admitting: Internal Medicine

## 2018-06-24 ENCOUNTER — Ambulatory Visit (INDEPENDENT_AMBULATORY_CARE_PROVIDER_SITE_OTHER): Payer: 59 | Admitting: Internal Medicine

## 2018-06-24 ENCOUNTER — Ambulatory Visit
Admission: RE | Admit: 2018-06-24 | Discharge: 2018-06-24 | Disposition: A | Discharge status: Home / Self Care | Payer: 59 | Source: Ambulatory Visit | Attending: Internal Medicine | Admitting: Internal Medicine

## 2018-06-24 VITALS — BP 120/80 | HR 99 | Temp 98.2°F | Ht 64.0 in | Wt 148.3 lb

## 2018-06-24 DIAGNOSIS — R635 Abnormal weight gain: Secondary | ICD-10-CM

## 2018-06-24 DIAGNOSIS — R05 Cough: Secondary | ICD-10-CM | POA: Diagnosis not present

## 2018-06-24 DIAGNOSIS — J22 Unspecified acute lower respiratory infection: Secondary | ICD-10-CM | POA: Diagnosis not present

## 2018-06-24 DIAGNOSIS — J9801 Acute bronchospasm: Secondary | ICD-10-CM

## 2018-06-24 DIAGNOSIS — R0989 Other specified symptoms and signs involving the circulatory and respiratory systems: Secondary | ICD-10-CM | POA: Diagnosis not present

## 2018-06-24 LAB — T4, FREE: Free T4: 1 ng/dL (ref 0.8–1.8)

## 2018-06-24 LAB — TSH: TSH: 1.19 mIU/L (ref 0.40–4.50)

## 2018-06-24 MED ORDER — ALBUTEROL SULFATE HFA 108 (90 BASE) MCG/ACT IN AERS: 2.0000 | INHALATION_SPRAY | Freq: Four times a day (QID) | RESPIRATORY_TRACT | 0 refills | Status: DC | PRN

## 2018-06-24 MED ORDER — DOXYCYCLINE HYCLATE 100 MG PO TABS: 100.0000 mg | ORAL_TABLET | Freq: Two times a day (BID) | ORAL | 0 refills | Status: DC

## 2018-06-24 MED ORDER — PREDNISONE 10 MG PO TABS: ORAL_TABLET | ORAL | 0 refills | Status: DC

## 2018-06-24 NOTE — Progress Notes (Signed)
   Subjective:    Patient ID: Molly Brady, female    DOB: [birth year], 57 y.o.   MRN: [id number]  HPI 57 year old Female had onset of sore throat Tuesday, November 19 followed by cough on Wednesday, November 20th.  Cough is been nonproductive.  [name] has been hospitalized with para influenza.  She has been around him.  No fever or shaking chills.  Just cough wheezing and shortness of breath.  Also wants thyroid checked because of recent weight gain she says.  This will be done today as well.    Review of Systems no discolored sputum.  Has malaise and fatigue.     Objective:   Physical Exam Skin warm and dry.  Nodes none.  Pharynx is clear.  TMs are clear.  Neck is supple without adenopathy.  She has bilateral rhonchi and?  Rales right upper lobe.  Cardiac exam regular rate and rhythm normal S1 and S2.  Chest x-ray shows no infiltrate but hyperinflation of lungs.  She received DuoNeb nebulizer treatment in the office with improvement in bronchospasm.  She is afebrile.  Blood pressure 120/80.  Pulse oximetry 97%  She has gained 10 pounds since April 2019       Assessment & Plan:  10 pound weight gain-thyroid functions checked  Acute bronchospasm-likely has parainfluenza which she probably contracted from her grandson.  She will use albuterol inhaler 2 sprays p.o. 4 times daily.  Acute lower respiratory infection will be treated with doxycycline 100 mg twice daily for 10 days.  She does not want narcotic cough medication called in.  She will take Delsym.  Take prednisone and tapering course going from 60 mg to 0 mg over 7 days.  Use albuterol inhaler 2 sprays p.o. 4 times a day.  Rest and drink plenty of fluids.  25 minutes spent with patient including at least 50% face-to-face time explaining diagnosis, treatment and answering questions

## 2018-06-24 NOTE — Progress Notes (Deleted)
Onset mid week, sore throat then by Wednesday was coughing. Cough nonproductive. No fever or chills. Has malaise and fatigue.

## 2018-06-24 NOTE — Patient Instructions (Signed)
Doxycycline 100 mg twice a day for 10 days.  Albuterol inhaler 2 sprays p.o. 4 times a day as needed for cough and wheezing.  May take Delsym over-the-counter for cough.  Prednisone and tapering course going from 60 mg to 0 mg over 7 days.

## 2018-06-27 DIAGNOSIS — M7632 Iliotibial band syndrome, left leg: Secondary | ICD-10-CM | POA: Diagnosis not present

## 2018-06-27 DIAGNOSIS — M25562 Pain in left knee: Secondary | ICD-10-CM | POA: Diagnosis not present

## 2018-07-04 ENCOUNTER — Telehealth: Payer: Self-pay

## 2018-07-04 NOTE — Telephone Encounter (Signed)
PA approved for  amphetamine-dextroamphetamine 30mg  through 07/04/2019

## 2018-07-14 ENCOUNTER — Other Ambulatory Visit: Payer: Self-pay | Admitting: Psychiatry

## 2018-07-14 DIAGNOSIS — Z8669 Personal history of other diseases of the nervous system and sense organs: Secondary | ICD-10-CM

## 2018-07-18 NOTE — Telephone Encounter (Signed)
Need to review paper chart  

## 2018-07-24 ENCOUNTER — Encounter: Payer: Self-pay | Admitting: Emergency Medicine

## 2018-07-24 DIAGNOSIS — F411 Generalized anxiety disorder: Secondary | ICD-10-CM

## 2018-07-24 DIAGNOSIS — F410 Panic disorder [episodic paroxysmal anxiety] without agoraphobia: Secondary | ICD-10-CM | POA: Insufficient documentation

## 2018-07-29 DIAGNOSIS — G8918 Other acute postprocedural pain: Secondary | ICD-10-CM | POA: Diagnosis not present

## 2018-07-29 DIAGNOSIS — M65862 Other synovitis and tenosynovitis, left lower leg: Secondary | ICD-10-CM | POA: Diagnosis not present

## 2018-07-29 DIAGNOSIS — M19041 Primary osteoarthritis, right hand: Secondary | ICD-10-CM | POA: Diagnosis not present

## 2018-07-29 DIAGNOSIS — M7632 Iliotibial band syndrome, left leg: Secondary | ICD-10-CM | POA: Diagnosis not present

## 2018-07-29 DIAGNOSIS — M1811 Unilateral primary osteoarthritis of first carpometacarpal joint, right hand: Secondary | ICD-10-CM | POA: Diagnosis not present

## 2018-08-10 DIAGNOSIS — M13841 Other specified arthritis, right hand: Secondary | ICD-10-CM | POA: Diagnosis not present

## 2018-08-10 DIAGNOSIS — M79641 Pain in right hand: Secondary | ICD-10-CM | POA: Diagnosis not present

## 2018-08-15 ENCOUNTER — Encounter: Payer: Self-pay | Admitting: Psychiatry

## 2018-08-15 ENCOUNTER — Ambulatory Visit (INDEPENDENT_AMBULATORY_CARE_PROVIDER_SITE_OTHER): Payer: 59 | Admitting: Psychiatry

## 2018-08-15 VITALS — BP 120/88 | HR 84

## 2018-08-15 DIAGNOSIS — F3342 Major depressive disorder, recurrent, in full remission: Secondary | ICD-10-CM

## 2018-08-15 DIAGNOSIS — F5105 Insomnia due to other mental disorder: Secondary | ICD-10-CM

## 2018-08-15 DIAGNOSIS — F411 Generalized anxiety disorder: Secondary | ICD-10-CM

## 2018-08-15 DIAGNOSIS — F4001 Agoraphobia with panic disorder: Secondary | ICD-10-CM | POA: Diagnosis not present

## 2018-08-15 DIAGNOSIS — F900 Attention-deficit hyperactivity disorder, predominantly inattentive type: Secondary | ICD-10-CM

## 2018-08-15 DIAGNOSIS — G43009 Migraine without aura, not intractable, without status migrainosus: Secondary | ICD-10-CM

## 2018-08-15 MED ORDER — AMPHETAMINE-DEXTROAMPHETAMINE 30 MG PO TABS: 15.0000 mg | ORAL_TABLET | Freq: Three times a day (TID) | ORAL | 0 refills | Status: DC

## 2018-08-15 MED ORDER — LISDEXAMFETAMINE DIMESYLATE 70 MG PO CAPS: 70.0000 mg | ORAL_CAPSULE | Freq: Every day | ORAL | 0 refills | Status: DC

## 2018-08-15 MED ORDER — VYVANSE 70 MG PO CAPS: 70.0000 mg | ORAL_CAPSULE | Freq: Every day | ORAL | 0 refills | Status: DC

## 2018-08-15 NOTE — Progress Notes (Signed)
MIRIA CAPPELLI [id number] [birth year] 58 y.o.  Subjective:   Patient ID:  DELAYNIE STETZER is a 58 y.o. (DOB 1961/06/15) female.  Chief Complaint:  Chief Complaint  Patient presents with  . Follow-up    Medication management  . Migraine    worse    HPI  Last seen in July. ALISA STJAMES presents to the office today for follow-up of ADD, depression and anxiety.    Everything is fine.  Patient reports stable mood and denies depressed or irritable moods.  Patient denies any recent difficulty with anxiety.  Patient denies difficulty with sleep initiation or maintenance.  To sleep 4:30 and up at 12. Takes Xanax for sleep . Denies appetite disturbance.  Patient reports that energy and motivation have been good.  Patient denies any difficulty with concentration.  Patient denies any suicidal ideation. No panic.  Pleased with meds.  A few more migraines. No aura.  Lately 1-2/week but then may go weeks without one.  Triptan will work if used quickly.  More binge eating lately.  Started before the holidays.  Maybe for the first time in my life I get to decide.    Past Pscyh  Med trials: Abilify 10 mg, Paxil 40 mg,  gabapentin 400 mg twice daily,Xanax XR trazodone, Ambien with side effects, Lexapro 30 mg, lamotrigine, sertraline 25 mg, buspirone 30 mg twice daily, topiramate with side effects, duloxetine 20 mg, Dexedrine  Review of Systems:  Review of Systems  Musculoskeletal: Positive for arthralgias and joint swelling.  Neurological: Positive for headaches. Negative for tremors and weakness.  Psychiatric/Behavioral: Negative for agitation, behavioral problems, confusion, decreased concentration, dysphoric mood, hallucinations, self-injury, sleep disturbance and suicidal ideas. The patient is not nervous/anxious and is not hyperactive.     Medications: I have reviewed the patient's current medications.  Current Outpatient Medications  Medication Sig Dispense Refill  . ALPRAZolam (XANAX) 0.5 MG  tablet Take 0.5 mg by mouth at bedtime. TAKE 1 TO 2 TABS PO AT BED TIME PRN    . amphetamine-dextroamphetamine (ADDERALL) 30 MG tablet Take 0.5 tablets by mouth 3 (three) times daily. Take 1/2 a tab TID 45 tablet 0  . gabapentin (NEURONTIN) 400 MG capsule Take 400 mg by mouth 2 (two) times daily.    Marland Kitchen PARoxetine (PAXIL) 40 MG tablet Take 40 mg by mouth daily.    . rizatriptan (MAXALT) 10 MG tablet TAKE 1 TABLET BY MOUTH AT ONSET OF HEADACHE, MAY REPEAT IN 2 HOURS 12 tablet 2  . VYVANSE 70 MG capsule Take 1 capsule (70 mg total) by mouth daily. 30 capsule 0  . albuterol (PROVENTIL HFA;VENTOLIN HFA) 108 (90 Base) MCG/ACT inhaler Inhale 2 puffs into the lungs every 6 (six) hours as needed for wheezing or shortness of breath. (Patient not taking: Reported on 08/15/2018) 1 Inhaler 0  . [START ON 09/12/2018] amphetamine-dextroamphetamine (ADDERALL) 30 MG tablet Take 0.5 tablets by mouth 3 (three) times daily. 45 tablet 0  . [START ON 10/10/2018] amphetamine-dextroamphetamine (ADDERALL) 30 MG tablet Take 0.5 tablets by mouth 3 (three) times daily. 45 tablet 0  . doxycycline (VIBRA-TABS) 100 MG tablet Take 1 tablet (100 mg total) by mouth 2 (two) times daily. (Patient not taking: Reported on 08/15/2018) 20 tablet 0  . [START ON 09/12/2018] lisdexamfetamine (VYVANSE) 70 MG capsule Take 1 capsule (70 mg total) by mouth daily. 30 capsule 0  . [START ON 10/10/2018] lisdexamfetamine (VYVANSE) 70 MG capsule Take 1 capsule (70 mg total) by mouth daily. 30 capsule 0  .  predniSONE (DELTASONE) 10 MG tablet Take in tapering course as directed 6-5-4-3-2-1 (Patient not taking: Reported on 08/15/2018) 21 tablet 0   No current facility-administered medications for this visit.     Medication Side Effects: None  Allergies: No Known Allergies  History reviewed. No pertinent past medical history.  Family History  Problem Relation Age of Onset  . Lung cancer Mother   . Asthma Mother   . Lung cancer Father   . Asthma Sister      Social History   Socioeconomic History  . Marital status: Married    Spouse name: Not on file  . Number of children: Not on file  . Years of education: Not on file  . Highest education level: Not on file  Occupational History  . Not on file  Social Needs  . Financial resource strain: Not on file  . Food insecurity:    Worry: Not on file    Inability: Not on file  . Transportation needs:    Medical: Not on file    Non-medical: Not on file  Tobacco Use  . Smoking status: Never Smoker  . Smokeless tobacco: Never Used  Substance and Sexual Activity  . Alcohol use: Not Currently  . Drug use: Not on file  . Sexual activity: Not on file  Lifestyle  . Physical activity:    Days per week: Not on file    Minutes per session: Not on file  . Stress: Not on file  Relationships  . Social connections:    Talks on phone: Not on file    Gets together: Not on file    Attends religious service: Not on file    Active member of club or organization: Not on file    Attends meetings of clubs or organizations: Not on file    Relationship status: Not on file  . Intimate partner violence:    Fear of current or ex partner: Not on file    Emotionally abused: Not on file    Physically abused: Not on file    Forced sexual activity: Not on file  Other Topics Concern  . Not on file  Social History Narrative  . Not on file    Past Medical History, Surgical history, Social history, and Family history were reviewed and updated as appropriate.   Please see review of systems for further details on the patient's review from today.   Objective:   Physical Exam:  BP 120/88 (BP Location: Left Arm)   Pulse 84   Physical Exam Constitutional:      General: She is not in acute distress.    Appearance: She is well-developed.  Musculoskeletal:        General: No deformity.  Neurological:     Mental Status: She is alert and oriented to person, place, and time.     Motor: No tremor.      Coordination: Coordination normal.     Gait: Gait normal.  Psychiatric:        Attention and Perception: Attention and perception normal.        Mood and Affect: Mood is not anxious or depressed. Affect is not labile, blunt, angry or inappropriate.        Speech: Speech normal.        Behavior: Behavior normal.        Thought Content: Thought content normal. Thought content does not include homicidal or suicidal ideation. Thought content does not include homicidal or suicidal plan.  Cognition and Memory: Cognition normal.        Judgment: Judgment normal.     Comments: Insight intact. No auditory or visual hallucinations. No delusions.      Lab Review:     Component Value Date/Time   NA 140 11/02/2017 0924   K 4.3 11/02/2017 0924   CL 101 11/02/2017 0924   CO2 29 11/02/2017 0924   GLUCOSE 90 11/02/2017 0924   BUN 8 11/02/2017 0924   CREATININE 0.75 11/02/2017 0924   CALCIUM 9.9 11/02/2017 0924   PROT 7.3 12/28/2017 0907   AST 22 12/28/2017 0907   ALT 21 12/28/2017 0907   BILITOT 0.4 12/28/2017 0907   GFRNONAA 90 11/02/2017 0924   GFRAA 104 11/02/2017 0924       Component Value Date/Time   WBC 4.9 11/02/2017 0924   RBC 4.37 11/02/2017 0924   HGB 13.3 11/02/2017 0924   HCT 39.5 11/02/2017 0924   PLT 304 11/02/2017 0924   MCV 90.4 11/02/2017 0924   MCH 30.4 11/02/2017 0924   MCHC 33.7 11/02/2017 0924   RDW 11.9 11/02/2017 0924   LYMPHSABS 2,092 11/02/2017 0924   EOSABS 39 11/02/2017 0924   BASOSABS 39 11/02/2017 0924    No results found for: POCLITH, LITHIUM   No results found for: PHENYTOIN, PHENOBARB, VALPROATE, CBMZ   .res Assessment: Plan:    Attention deficit hyperactivity disorder (ADHD), predominantly inattentive type - Plan: VYVANSE 70 MG capsule, lisdexamfetamine (VYVANSE) 70 MG capsule, lisdexamfetamine (VYVANSE) 70 MG capsule, amphetamine-dextroamphetamine (ADDERALL) 30 MG tablet, amphetamine-dextroamphetamine (ADDERALL) 30 MG tablet,  amphetamine-dextroamphetamine (ADDERALL) 30 MG tablet  Generalized anxiety disorder  Panic disorder with agoraphobia  Major depression, recurrent, full remission (HCC)  Migraine without aura and without status migrainosus, not intractable  Insomnia due to mental condition   Overall satisfied with plans and meds.  Depression and anxiety under control.  Needs high dosage Adderall/Vyvanse.  Tolerated and BP ok.  Discussed potential benefits, risks, and side effects of stimulants with patient to include increased heart rate, palpitations, insomnia, increased anxiety, increased irritability, or decreased appetite.  Instructed patient to contact office if experiencing any significant tolerability issues.  No evidence for abuse.  Pleased with migraine meds overall.  Triptans help. And Gabapentin is preventative.  Sleep ok with Xanax.  We discussed the short-term risks associated with benzodiazepines including sedation and increased fall risk among others.  Discussed long-term side effect risk including dependence, potential withdrawal symptoms, and the potential eventual dose-related risk of dementia.  FU 6 mo  Lynder Parents, MD, DFAPA   Please see After Visit Summary for patient specific instructions.  Future Appointments  Date Time Provider [place]  11/28/2018  9:00 AM Baxley, Cresenciano Lick, MD [facility]  12/01/2018 10:00 AM Baxley, Cresenciano Lick, MD [facility]    No orders of the defined types were placed in this encounter.     -------------------------------

## 2018-08-31 DIAGNOSIS — M13841 Other specified arthritis, right hand: Secondary | ICD-10-CM | POA: Diagnosis not present

## 2018-09-05 DIAGNOSIS — M13841 Other specified arthritis, right hand: Secondary | ICD-10-CM | POA: Diagnosis not present

## 2018-09-05 DIAGNOSIS — M79645 Pain in left finger(s): Secondary | ICD-10-CM | POA: Diagnosis not present

## 2018-10-12 ENCOUNTER — Other Ambulatory Visit: Payer: Self-pay | Admitting: Psychiatry

## 2018-10-12 DIAGNOSIS — F419 Anxiety disorder, unspecified: Secondary | ICD-10-CM

## 2018-10-12 DIAGNOSIS — F329 Major depressive disorder, single episode, unspecified: Secondary | ICD-10-CM

## 2018-10-12 DIAGNOSIS — F32A Depression, unspecified: Secondary | ICD-10-CM

## 2018-10-12 NOTE — Telephone Encounter (Signed)
Need to review paper chart not seen

## 2018-10-19 ENCOUNTER — Other Ambulatory Visit: Payer: Self-pay | Admitting: Psychiatry

## 2018-10-19 DIAGNOSIS — F419 Anxiety disorder, unspecified: Secondary | ICD-10-CM

## 2018-10-19 DIAGNOSIS — F329 Major depressive disorder, single episode, unspecified: Secondary | ICD-10-CM

## 2018-10-19 DIAGNOSIS — F32A Depression, unspecified: Secondary | ICD-10-CM

## 2018-11-08 ENCOUNTER — Other Ambulatory Visit: Payer: Self-pay | Admitting: Psychiatry

## 2018-11-08 DIAGNOSIS — F900 Attention-deficit hyperactivity disorder, predominantly inattentive type: Secondary | ICD-10-CM

## 2018-11-09 ENCOUNTER — Other Ambulatory Visit: Payer: Self-pay

## 2018-11-09 NOTE — Telephone Encounter (Signed)
Both last filled 03/11

## 2018-11-17 ENCOUNTER — Telehealth: Payer: Self-pay | Admitting: Internal Medicine

## 2018-11-17 NOTE — Telephone Encounter (Signed)
Receive message to cancel CPE and Labs, sent message back to let me know if she would like to go ahead and reschedule for June or July.

## 2018-11-21 ENCOUNTER — Other Ambulatory Visit: Payer: Self-pay | Admitting: Psychiatry

## 2018-11-21 DIAGNOSIS — Z8669 Personal history of other diseases of the nervous system and sense organs: Secondary | ICD-10-CM

## 2018-11-28 ENCOUNTER — Other Ambulatory Visit: Payer: 59 | Admitting: Internal Medicine

## 2018-12-01 ENCOUNTER — Encounter: Payer: 59 | Admitting: Internal Medicine

## 2018-12-14 ENCOUNTER — Telehealth: Payer: Self-pay

## 2018-12-14 ENCOUNTER — Other Ambulatory Visit: Payer: Self-pay | Admitting: Psychiatry

## 2018-12-14 DIAGNOSIS — F900 Attention-deficit hyperactivity disorder, predominantly inattentive type: Secondary | ICD-10-CM

## 2018-12-14 NOTE — Telephone Encounter (Signed)
Prior authorization submitted for vyvanse 70mg  and Amphetamine-Dextroamphetamine 30 mg 1.5 tablets through [employer] approved effective 12/14/2018-12/14/2019

## 2018-12-15 NOTE — Telephone Encounter (Signed)
Has appt 06/16 Last fill 04/08 both

## 2019-01-12 ENCOUNTER — Other Ambulatory Visit: Payer: Self-pay | Admitting: Psychiatry

## 2019-01-12 DIAGNOSIS — F900 Attention-deficit hyperactivity disorder, predominantly inattentive type: Secondary | ICD-10-CM

## 2019-01-12 NOTE — Telephone Encounter (Signed)
Has appt 06/16, last fill 05/14 on both

## 2019-01-17 ENCOUNTER — Other Ambulatory Visit: Payer: Self-pay

## 2019-01-17 ENCOUNTER — Encounter: Payer: Self-pay | Admitting: Psychiatry

## 2019-01-17 ENCOUNTER — Ambulatory Visit (INDEPENDENT_AMBULATORY_CARE_PROVIDER_SITE_OTHER): Payer: 59 | Admitting: Psychiatry

## 2019-01-17 DIAGNOSIS — F3342 Major depressive disorder, recurrent, in full remission: Secondary | ICD-10-CM

## 2019-01-17 DIAGNOSIS — F900 Attention-deficit hyperactivity disorder, predominantly inattentive type: Secondary | ICD-10-CM

## 2019-01-17 DIAGNOSIS — F5105 Insomnia due to other mental disorder: Secondary | ICD-10-CM

## 2019-01-17 DIAGNOSIS — G43009 Migraine without aura, not intractable, without status migrainosus: Secondary | ICD-10-CM

## 2019-01-17 DIAGNOSIS — F411 Generalized anxiety disorder: Secondary | ICD-10-CM

## 2019-01-17 DIAGNOSIS — F4001 Agoraphobia with panic disorder: Secondary | ICD-10-CM

## 2019-01-17 MED ORDER — LISDEXAMFETAMINE DIMESYLATE 70 MG PO CAPS: 70.0000 mg | ORAL_CAPSULE | Freq: Every day | ORAL | 0 refills | Status: DC

## 2019-01-17 MED ORDER — VYVANSE 70 MG PO CAPS: 70.0000 mg | ORAL_CAPSULE | Freq: Every day | ORAL | 0 refills | Status: DC

## 2019-01-17 MED ORDER — AMPHETAMINE-DEXTROAMPHETAMINE 30 MG PO TABS: 15.0000 mg | ORAL_TABLET | Freq: Three times a day (TID) | ORAL | 0 refills | Status: DC

## 2019-01-17 NOTE — Progress Notes (Signed)
Molly Brady [id number] 1960/11/13 58 y.o.  Subjective:   Patient ID:  Molly Brady is a 58 y.o. (DOB April 25, 1961) female.  Chief Complaint:  Chief Complaint  Patient presents with  . Follow-up    Medication Management  . ADD    Medication Management  . Depression    Medication Management    Depression        Associated symptoms include headaches.  Associated symptoms include no decreased concentration and no suicidal ideas.  Molly Brady presents to the office today for follow-up of ADD, depression and anxiety.    Last seen August 25, 2018.  No meds were changed  Everything is fine.  Patient reports stable mood and denies depressed or irritable moods.  Patient denies any recent difficulty with anxiety AND manageable.  Patient denies difficulty with sleep initiation or maintenance.  To sleep 4:30 and up at 12. Schedule dictated by care of grandchild and work full time and it's going well.  6-8 hours sleep. Takes Xanax for sleep . Denies appetite disturbance.  Patient reports that energy and motivation have been good.  Patient denies any difficulty with concentration.  Patient denies any suicidal ideation. No panic.  Pleased with meds.  Molly Brady will be 3 July 5.  Pleased with high dosage stimulants.  Working fine and tolerated.  A few more migraines still than usual. No aura.  Lately 1-2/week but then may go weeks without one.  Triptan will work if used quickly.  More binge eating lately.  Started before the holidays.  Maybe for the first time in my life I get to decide.    Past Pscyh  Med trials: Abilify 10 mg, Paxil 40 mg,  gabapentin 400 mg twice daily,Xanax XR trazodone, Ambien with side effects, Lexapro 30 mg, lamotrigine, sertraline 25 mg, buspirone 30 mg twice daily, topiramate with side effects, duloxetine 20 mg, Dexedrine  Review of Systems:  Review of Systems  Musculoskeletal: Positive for arthralgias and joint swelling.  Neurological: Positive for headaches. Negative  for tremors and weakness.  Psychiatric/Behavioral: Positive for depression. Negative for agitation, behavioral problems, confusion, decreased concentration, dysphoric mood, hallucinations, self-injury, sleep disturbance and suicidal ideas. The patient is not nervous/anxious and is not hyperactive.     Medications: I have reviewed the patient's current medications.  Current Outpatient Medications  Medication Sig Dispense Refill  . ALPRAZolam (XANAX) 0.5 MG tablet TAKE 1 TO 2 TABLETS BY MOUTH EACH NIGHT AT BEDTIME AS NEEDED 60 tablet 5  . amphetamine-dextroamphetamine (ADDERALL) 30 MG tablet Take 0.5 tablets by mouth 3 (three) times daily. Take 1/2 a tab TID 45 tablet 0  . amphetamine-dextroamphetamine (ADDERALL) 30 MG tablet Take 0.5 tablets by mouth 3 (three) times daily. 45 tablet 0  . amphetamine-dextroamphetamine (ADDERALL) 30 MG tablet TAKE 1/2 TABLET BY MOUTH 3 TIMES DAILY 45 tablet 0  . gabapentin (NEURONTIN) 400 MG capsule TAKE 1 CAPSULE BY MOUTH TWICE DAILY 60 capsule 5  . lisdexamfetamine (VYVANSE) 70 MG capsule Take 1 capsule (70 mg total) by mouth daily. 30 capsule 0  . PARoxetine (PAXIL) 40 MG tablet TAKE 1 TABLET BY MOUTH DAILY 30 tablet 5  . rizatriptan (MAXALT) 10 MG tablet TAKE 1 TABLET BY MOUTH AT ONSET OF HEADACHE, MAY REPEAT IN 2 HOURS 12 tablet 2  . VYVANSE 70 MG capsule Take 1 capsule (70 mg total) by mouth daily. 30 capsule 0  . VYVANSE 70 MG capsule TAKE 1 CAPSULE BY MOUTH DAILY 30 capsule 0   No current  facility-administered medications for this visit.     Medication Side Effects: None  Allergies: No Known Allergies  History reviewed. No pertinent past medical history.  Family History  Problem Relation Age of Onset  . Lung cancer Mother   . Asthma Mother   . Lung cancer Father   . Asthma Sister     Social History   Socioeconomic History  . Marital status: Married    Spouse name: Not on file  . Number of children: Not on file  . Years of education: Not  on file  . Highest education level: Not on file  Occupational History  . Not on file  Social Needs  . Financial resource strain: Not on file  . Food insecurity    Worry: Not on file    Inability: Not on file  . Transportation needs    Medical: Not on file    Non-medical: Not on file  Tobacco Use  . Smoking status: Never Smoker  . Smokeless tobacco: Never Used  Substance and Sexual Activity  . Alcohol use: Not Currently  . Drug use: Not on file  . Sexual activity: Not on file  Lifestyle  . Physical activity    Days per week: Not on file    Minutes per session: Not on file  . Stress: Not on file  Relationships  . Social Herbalist on phone: Not on file    Gets together: Not on file    Attends religious service: Not on file    Active member of club or organization: Not on file    Attends meetings of clubs or organizations: Not on file    Relationship status: Not on file  . Intimate partner violence    Fear of current or ex partner: Not on file    Emotionally abused: Not on file    Physically abused: Not on file    Forced sexual activity: Not on file  Other Topics Concern  . Not on file  Social History Narrative  . Not on file    Past Medical History, Surgical history, Social history, and Family history were reviewed and updated as appropriate.   Please see review of systems for further details on the patient's review from today.   Objective:   Physical Exam:  There were no vitals taken for this visit.  Physical Exam Constitutional:      General: She is not in acute distress.    Appearance: She is well-developed.  Musculoskeletal:        General: No deformity.  Neurological:     Mental Status: She is alert and oriented to person, place, and time.     Motor: No tremor.     Coordination: Coordination normal.     Gait: Gait normal.  Psychiatric:        Attention and Perception: Attention and perception normal.        Mood and Affect: Mood is not  anxious or depressed. Affect is not labile, blunt, angry or inappropriate.        Speech: Speech normal.        Behavior: Behavior normal.        Thought Content: Thought content normal. Thought content does not include homicidal or suicidal ideation. Thought content does not include homicidal or suicidal plan.        Cognition and Memory: Cognition normal.        Judgment: Judgment normal.     Comments: Insight intact. No auditory  or visual hallucinations. No delusions.      Lab Review:     Component Value Date/Time   NA 140 11/02/2017 0924   K 4.3 11/02/2017 0924   CL 101 11/02/2017 0924   CO2 29 11/02/2017 0924   GLUCOSE 90 11/02/2017 0924   BUN 8 11/02/2017 0924   CREATININE 0.75 11/02/2017 0924   CALCIUM 9.9 11/02/2017 0924   PROT 7.3 12/28/2017 0907   AST 22 12/28/2017 0907   ALT 21 12/28/2017 0907   BILITOT 0.4 12/28/2017 0907   GFRNONAA 90 11/02/2017 0924   GFRAA 104 11/02/2017 0924       Component Value Date/Time   WBC 4.9 11/02/2017 0924   RBC 4.37 11/02/2017 0924   HGB 13.3 11/02/2017 0924   HCT 39.5 11/02/2017 0924   PLT 304 11/02/2017 0924   MCV 90.4 11/02/2017 0924   MCH 30.4 11/02/2017 0924   MCHC 33.7 11/02/2017 0924   RDW 11.9 11/02/2017 0924   LYMPHSABS 2,092 11/02/2017 0924   EOSABS 39 11/02/2017 0924   BASOSABS 39 11/02/2017 0924    No results found for: POCLITH, LITHIUM   No results found for: PHENYTOIN, PHENOBARB, VALPROATE, CBMZ   .res Assessment: Plan:    [place] was seen today for follow-up, add and depression.  Diagnoses and all orders for this visit:  Attention deficit hyperactivity disorder (ADHD), predominantly inattentive type -     amphetamine-dextroamphetamine (ADDERALL) 30 MG tablet; Take 0.5 tablets by mouth 3 (three) times daily. Take 1/2 a tab TID -     amphetamine-dextroamphetamine (ADDERALL) 30 MG tablet; Take 0.5 tablets by mouth 3 (three) times daily. -     amphetamine-dextroamphetamine (ADDERALL) 30 MG tablet; Take 0.5  tablets by mouth 3 (three) times daily. -     VYVANSE 70 MG capsule; Take 1 capsule (70 mg total) by mouth daily. -     lisdexamfetamine (VYVANSE) 70 MG capsule; Take 1 capsule (70 mg total) by mouth daily. -     lisdexamfetamine (VYVANSE) 70 MG capsule; Take 1 capsule (70 mg total) by mouth daily.  Generalized anxiety disorder  Panic disorder with agoraphobia  Major depression, recurrent, full remission ([place])  Migraine without aura and without status migrainosus, not intractable  Insomnia due to mental condition   Overall satisfied with plans and meds.  Depression and anxiety under control.  Needs high dosage Adderall/Vyvanse.  Tolerated and BP ok.  Discussed potential benefits, risks, and side effects of stimulants with patient to include increased heart rate, palpitations, insomnia, increased anxiety, increased irritability, or decreased appetite.  Instructed patient to contact office if experiencing any significant tolerability issues.  No evidence for abuse.  Pleased with migraine meds overall.  Triptans help. And Gabapentin is preventative.  Sleep ok with Xanax.  We discussed the short-term risks associated with benzodiazepines including sedation and increased fall risk among others.  Discussed long-term side effect risk including dependence, potential withdrawal symptoms, and the potential eventual dose-related risk of dementia.  Not ideal to be taking alprazolam and stimulants however she does not take them at the same time of day and therefore they do not conflict.  No med changes indicated today.  FU 6 mo  Lynder Parents, MD, DFAPA   Please see After Visit Summary for patient specific instructions.  No future appointments.  No orders of the defined types were placed in this encounter.     -------------------------------

## 2019-02-08 ENCOUNTER — Ambulatory Visit: Payer: 59 | Admitting: Psychiatry

## 2019-04-03 ENCOUNTER — Other Ambulatory Visit: Payer: Self-pay | Admitting: Psychiatry

## 2019-04-03 DIAGNOSIS — Z8669 Personal history of other diseases of the nervous system and sense organs: Secondary | ICD-10-CM

## 2019-04-03 NOTE — Telephone Encounter (Signed)
Next appt 07/19/2019

## 2019-04-06 ENCOUNTER — Telehealth: Payer: Self-pay

## 2019-04-06 NOTE — Telephone Encounter (Signed)
Prior authorization submitted and denied through [employer] for patients Maxalt 10 mg for migraines.See denial response.  On behalf of [place], [employer] is responsible for reviewing pharmacy services provided to [facility]. We received a request from your prescriber for coverage of Rizatriptan Tab 10mg . We reviewed all of the information you and/or your doctor sent to [place] and sent the information to an appropriate physician specialist if needed. Unfortunately, we must deny coverage for Rizatriptan Benzoate. Why was my request denied? This request was denied because you did not meet the following clinical requirements: The requested medication and/or diagnosis are not a covered benefit and excluded from coverage in accordance with the terms and conditions of your plan benefit. Therefore, the request has been administratively denied.

## 2019-04-06 NOTE — Telephone Encounter (Signed)
Left voicemail with information and to call back to discuss options

## 2019-04-06 NOTE — Telephone Encounter (Signed)
The patient works for Education officer, environmental.  She will understand the process of trying to deal with the insurance company.  Contact her about this denial.  We can send in 1 of the other triptan's but it looks like they just refusing to cover this 1 regardless of what she is taken before.  I asked her what she wants [place] to do about it

## 2019-04-13 ENCOUNTER — Other Ambulatory Visit: Payer: Self-pay | Admitting: Psychiatry

## 2019-04-13 DIAGNOSIS — F900 Attention-deficit hyperactivity disorder, predominantly inattentive type: Secondary | ICD-10-CM

## 2019-04-13 DIAGNOSIS — F329 Major depressive disorder, single episode, unspecified: Secondary | ICD-10-CM

## 2019-04-13 DIAGNOSIS — F419 Anxiety disorder, unspecified: Secondary | ICD-10-CM

## 2019-04-13 DIAGNOSIS — F32A Depression, unspecified: Secondary | ICD-10-CM

## 2019-04-13 NOTE — Telephone Encounter (Signed)
Due back in Dec 

## 2019-04-29 ENCOUNTER — Emergency Department (HOSPITAL_BASED_OUTPATIENT_CLINIC_OR_DEPARTMENT_OTHER)
Admission: EM | Admit: 2019-04-29 | Discharge: 2019-04-29 | Disposition: A | Discharge status: Home / Self Care | Payer: 59 | Attending: Emergency Medicine | Admitting: Emergency Medicine

## 2019-04-29 ENCOUNTER — Emergency Department (HOSPITAL_BASED_OUTPATIENT_CLINIC_OR_DEPARTMENT_OTHER): Payer: 59

## 2019-04-29 ENCOUNTER — Encounter (HOSPITAL_BASED_OUTPATIENT_CLINIC_OR_DEPARTMENT_OTHER): Payer: Self-pay | Admitting: Emergency Medicine

## 2019-04-29 ENCOUNTER — Other Ambulatory Visit: Payer: Self-pay

## 2019-04-29 DIAGNOSIS — K2900 Acute gastritis without bleeding: Secondary | ICD-10-CM | POA: Diagnosis not present

## 2019-04-29 DIAGNOSIS — R1013 Epigastric pain: Secondary | ICD-10-CM | POA: Insufficient documentation

## 2019-04-29 DIAGNOSIS — Z79899 Other long term (current) drug therapy: Secondary | ICD-10-CM | POA: Diagnosis not present

## 2019-04-29 HISTORY — DX: Anxiety disorder, unspecified: F41.9

## 2019-04-29 HISTORY — DX: Depression, unspecified: F32.A

## 2019-04-29 HISTORY — DX: Attention-deficit hyperactivity disorder, unspecified type: F90.9

## 2019-04-29 HISTORY — DX: Migraine, unspecified, not intractable, without status migrainosus: G43.909

## 2019-04-29 LAB — URINALYSIS, ROUTINE W REFLEX MICROSCOPIC
Bilirubin Urine: NEGATIVE
Glucose, UA: NEGATIVE mg/dL
Hgb urine dipstick: NEGATIVE
Ketones, ur: NEGATIVE mg/dL
Leukocytes,Ua: NEGATIVE
Nitrite: NEGATIVE
Protein, ur: NEGATIVE mg/dL
Specific Gravity, Urine: 1.015 (ref 1.005–1.030)
pH: 7 (ref 5.0–8.0)

## 2019-04-29 LAB — COMPREHENSIVE METABOLIC PANEL
ALT: 26 U/L (ref 0–44)
AST: 30 U/L (ref 15–41)
Albumin: 4.4 g/dL (ref 3.5–5.0)
Alkaline Phosphatase: 87 U/L (ref 38–126)
Anion gap: 10 (ref 5–15)
BUN: 7 mg/dL (ref 6–20)
CO2: 28 mmol/L (ref 22–32)
Calcium: 9.5 mg/dL (ref 8.9–10.3)
Chloride: 101 mmol/L (ref 98–111)
Creatinine, Ser: 0.78 mg/dL (ref 0.44–1.00)
GFR calc Af Amer: >60 mL/min (ref >60)
GFR calc non Af Amer: >60 mL/min (ref >60)
Glucose, Bld: 116 mg/dL — ABNORMAL HIGH (ref 70–99)
Potassium: 4.1 mmol/L (ref 3.5–5.1)
Sodium: 139 mmol/L (ref 135–145)
Total Bilirubin: 0.3 mg/dL (ref 0.3–1.2)
Total Protein: 7.8 g/dL (ref 6.5–8.1)

## 2019-04-29 LAB — CBC WITH DIFFERENTIAL/PLATELET
Abs Immature Granulocytes: 0.01 10*3/uL (ref 0.00–0.07)
Basophils Absolute: 0.1 10*3/uL (ref 0.0–0.1)
Basophils Relative: 1 %
Eosinophils Absolute: 0.1 10*3/uL (ref 0.0–0.5)
Eosinophils Relative: 1 %
HCT: 42.4 % (ref 36.0–46.0)
Hemoglobin: 13.5 g/dL (ref 12.0–15.0)
Immature Granulocytes: 0 %
Lymphocytes Relative: 32 %
Lymphs Abs: 1.9 10*3/uL (ref 0.7–4.0)
MCH: 29.7 pg (ref 26.0–34.0)
MCHC: 31.8 g/dL (ref 30.0–36.0)
MCV: 93.4 fL (ref 80.0–100.0)
Monocytes Absolute: 0.4 10*3/uL (ref 0.1–1.0)
Monocytes Relative: 6 %
Neutro Abs: 3.5 10*3/uL (ref 1.7–7.7)
Neutrophils Relative %: 60 %
Platelets: 269 10*3/uL (ref 150–400)
RBC: 4.54 MIL/uL (ref 3.87–5.11)
RDW: 12.6 % (ref 11.5–15.5)
WBC: 5.9 10*3/uL (ref 4.0–10.5)
nRBC: 0 % (ref 0.0–0.2)

## 2019-04-29 LAB — LIPASE, BLOOD: Lipase: 54 U/L — ABNORMAL HIGH (ref 11–51)

## 2019-04-29 LAB — TROPONIN I (HIGH SENSITIVITY)
Troponin I (High Sensitivity): 3 ng/L (ref <18)
Troponin I (High Sensitivity): 3 ng/L (ref <18)

## 2019-04-29 MED ORDER — SODIUM CHLORIDE 0.9 % IV BOLUS
1000.0000 mL | Freq: Once | INTRAVENOUS | Status: AC
  Administered 2019-04-29: 21:00:00 1000 mL via INTRAVENOUS

## 2019-04-29 MED ORDER — MORPHINE SULFATE (PF) 4 MG/ML IV SOLN
4.0000 mg | Freq: Once | INTRAVENOUS | Status: AC
  Administered 2019-04-29: 4 mg via INTRAVENOUS
  Filled 2019-04-29: qty 1

## 2019-04-29 MED ORDER — ALUM & MAG HYDROXIDE-SIMETH 200-200-20 MG/5ML PO SUSP
30.0000 mL | Freq: Once | ORAL | Status: AC
  Administered 2019-04-29: 19:00:00 30 mL via ORAL
  Filled 2019-04-29: qty 30

## 2019-04-29 MED ORDER — FAMOTIDINE 20 MG PO TABS: 20.0000 mg | ORAL_TABLET | Freq: Two times a day (BID) | ORAL | 0 refills | Status: DC

## 2019-04-29 MED ORDER — LIDOCAINE VISCOUS HCL 2 % MT SOLN
15.0000 mL | Freq: Once | OROMUCOSAL | Status: AC
  Administered 2019-04-29: 19:00:00 15 mL via ORAL
  Filled 2019-04-29: qty 15

## 2019-04-29 MED ORDER — IOHEXOL 300 MG/ML  SOLN
100.0000 mL | Freq: Once | INTRAMUSCULAR | Status: AC | PRN
  Administered 2019-04-29: 100 mL via INTRAVENOUS

## 2019-04-29 NOTE — ED Triage Notes (Signed)
Reports epigastric pain that started yesterday.  Describes as aching.  Denies any radiation.  Denies SOB but endorses nausea and dizziness.  Reports not eating much this week due to nausea.

## 2019-04-29 NOTE — ED Provider Notes (Signed)
[facility] EMERGENCY DEPARTMENT Provider Note   CSN: [id number] Arrival date & time: 04/29/19  1746     History   Chief Complaint Chief Complaint  Patient presents with  . Abdominal Pain    HPI Molly Brady is a 58 y.o. female.     58 y.o female with a PMH of GAD, ADHD presents to the ED with a chief complaint of epigastric pain x yesterday.  Patient describes a constant aching sensation to the epigastric region without any radiation.  Reports she has taken some Maalox which was prescribed to her by a tele-visit which she had done earlier today which helped some with her symptoms.  She reports some anorexia, reports she has not been eating this last couple days, last had crackers around 3 PM today.  She also endorses nausea but has not had any episodes of vomiting.  Her last bowel movement was yesterday when was normal in nature.  She also endorses some dizziness, reports upon standing she feels like she is going to syncopized, she reports she has had several previous episodes just like this 1 and it is not out of her baseline.  Patient also endorses a mild headache, reports this feels like a migraine, does have a prescription for prophylactic therapy, last took this last night.  She denies any shortness of breath, chest pain, prior history of CAD.     The history is provided by the patient.    Past Medical History:  Diagnosis Date  . ADHD   . Anxiety and depression   . Migraines     Patient Active Problem List   Diagnosis Date Noted  . GAD (generalized anxiety disorder) 07/24/2018  . Panic 07/24/2018  . History of migraine headaches 11/27/2017  . Attention deficit disorder 11/27/2017  . Anxiety and depression 11/27/2017  . Elevated liver enzymes 11/27/2017    Past Surgical History:  Procedure Laterality Date  . UMBILICAL HERNIA REPAIR       OB History   No obstetric history on file.      Home Medications    Prior to Admission medications    Medication Sig Start Date End Date Taking? Authorizing Provider  ALPRAZolam Duanne Moron) 0.5 MG tablet TAKE 1-2 TABLETS BY MOUTH AT BEDTIME AS NEEDED 04/13/19   Cottle, Billey Co., MD  amphetamine-dextroamphetamine (ADDERALL) 30 MG tablet Take 0.5 tablets by mouth 3 (three) times daily. Take 1/2 a tab TID 01/17/19   Cottle, Billey Co., MD  amphetamine-dextroamphetamine (ADDERALL) 30 MG tablet Take 0.5 tablets by mouth 3 (three) times daily. 02/14/19   Cottle, Billey Co., MD  amphetamine-dextroamphetamine (ADDERALL) 30 MG tablet TAKE 1/2 TABLET BY MOUTH 3 TIMES DAILY 04/13/19   Cottle, Billey Co., MD  gabapentin (NEURONTIN) 400 MG capsule TAKE 1 CAPSULE BY MOUTH TWICE DAILY 10/13/18   Cottle, Billey Co., MD  lisdexamfetamine (VYVANSE) 70 MG capsule Take 1 capsule (70 mg total) by mouth daily. 02/14/19   Cottle, Billey Co., MD  PARoxetine (PAXIL) 40 MG tablet TAKE 1 TABLET BY MOUTH DAILY 04/13/19   Cottle, Billey Co., MD  rizatriptan (MAXALT) 10 MG tablet TAKE 1 TABLET BY MOUTH AT ONSET OF HEADACHE, MAY REPEAT IN 2 HOURS 04/03/19   Cottle, Billey Co., MD  VYVANSE 70 MG capsule Take 1 capsule (70 mg total) by mouth daily. 01/17/19   Cottle, Billey Co., MD  VYVANSE 70 MG capsule TAKE 1 CAPSULE BY MOUTH DAILY 04/13/19  Cottle, Billey Co., MD    Family History Family History  Problem Relation Age of Onset  . Lung cancer Mother   . Asthma Mother   . Lung cancer Father   . Asthma Sister     Social History Social History   Tobacco Use  . Smoking status: Never Smoker  . Smokeless tobacco: Never Used  Substance Use Topics  . Alcohol use: Not Currently  . Drug use: Not on file     Allergies   Patient has no known allergies.   Review of Systems Review of Systems  Constitutional: Negative for chills and fever.  HENT: Negative for ear pain and sore throat.   Eyes: Negative for pain and visual disturbance.  Respiratory: Negative for cough and shortness of breath.   Cardiovascular: Negative  for chest pain and palpitations.  Gastrointestinal: Positive for abdominal pain and nausea. Negative for vomiting.  Genitourinary: Negative for dysuria and hematuria.  Musculoskeletal: Negative for arthralgias and back pain.  Skin: Negative for color change and rash.  Neurological: Negative for seizures and syncope.  All other systems reviewed and are negative.    Physical Exam Updated Vital Signs BP 135/81 (BP Location: Left Arm) Comment: Simultaneous filing. User may not have seen previous data.  Pulse 71 Comment: Simultaneous filing. User may not have seen previous data.  Temp 97.8 F (36.6 C) (Oral)   Resp 20 Comment: Simultaneous filing. User may not have seen previous data.  Ht 5\' 4"  (1.626 m)   Wt 64.9 kg   SpO2 97% Comment: Simultaneous filing. User may not have seen previous data.  BMI 24.55 kg/m   Physical Exam Vitals signs and nursing note reviewed.  Constitutional:      General: She is not in acute distress.    Appearance: She is well-developed.     Comments: Non-ill-appearing.  HENT:     Head: Normocephalic and atraumatic.     Mouth/Throat:     Pharynx: No oropharyngeal exudate.  Eyes:     Pupils: Pupils are equal, round, and reactive to light.  Neck:     Musculoskeletal: Normal range of motion.  Cardiovascular:     Rate and Rhythm: Normal rate and regular rhythm.     Heart sounds: Normal heart sounds.  Pulmonary:     Effort: Pulmonary effort is normal. No respiratory distress.     Breath sounds: Normal breath sounds.     Comments: Lungs are clear to auscultation. Abdominal:     General: Bowel sounds are decreased. There is no distension.     Palpations: Abdomen is soft.     Tenderness: There is abdominal tenderness in the epigastric area. There is no right CVA tenderness, left CVA tenderness, guarding or rebound.     Comments: Mild tenderness with palpation on the epigastric region.    Musculoskeletal:        General: No tenderness or deformity.      Right lower leg: No edema.     Left lower leg: No edema.  Skin:    General: Skin is warm and dry.  Neurological:     Mental Status: She is alert and oriented to person, place, and time.      ED Treatments / Results  Labs (all labs ordered are listed, but only abnormal results are displayed) Labs Reviewed  COMPREHENSIVE METABOLIC PANEL - Abnormal; Notable for the following components:      Result Value   Glucose, Bld 116 (*)    All other components  within normal limits  LIPASE, BLOOD - Abnormal; Notable for the following components:   Lipase 54 (*)    All other components within normal limits  CBC WITH DIFFERENTIAL/PLATELET  URINALYSIS, ROUTINE W REFLEX MICROSCOPIC  TROPONIN I (HIGH SENSITIVITY)  TROPONIN I (HIGH SENSITIVITY)    EKG EKG Interpretation  Date/Time:  Saturday April 29 2019 19:16:35 EDT Ventricular Rate:  76 PR Interval:    QRS Duration: 92 QT Interval:  427 QTC Calculation: 481 R Axis:   68 Text Interpretation:  Sinus rhythm no acute ST/T changes Confirmed by Sherwood Gambler [phone]) on 04/29/2019 7:18:41 PM   Radiology No results found.  Procedures Procedures (including critical care time)  Medications Ordered in ED Medications  alum & mag hydroxide-simeth (MAALOX/MYLANTA) 200-200-20 MG/5ML suspension 30 mL (30 mLs Oral Given 04/29/19 1910)    And  lidocaine (XYLOCAINE) 2 % viscous mouth solution 15 mL (15 mLs Oral Given 04/29/19 1910)  sodium chloride 0.9 % bolus 1,000 mL (1,000 mLs Intravenous New Bag/Given 04/29/19 2049)  morphine 4 MG/ML injection 4 mg (4 mg Intravenous Given 04/29/19 2050)     Initial Impression / Assessment and Plan / ED Course  I have reviewed the triage vital signs and the nursing notes.  Pertinent labs & imaging results that were available during my care of the patient were reviewed by me and considered in my medical decision making (see chart for details).       Patient with no pertinent past medical history  presents to ED with complaints of epigastric pain times yesterday.  Patient reports a sudden onset of a sharp sensation to her epigastric region, no alleviating or exacerbating factors.  Has tried some Maalox prior to arrival with improvement in symptoms.  Patient did have a tele-health visit earlier today, was prescribed to take some Maalox which helped with her symptoms.  Patient arrived in the ED with stable vital signs, afebrile, satting at 97% on room air, no tachycardia.  She denies any previous history of alcohol use, pancreatitis, diverticulitis.  Patient's pain is mainly located in the epigastric region, not tender to palpation.  Bowel sounds are present.  No tenderness along the lower abdomen.  Low suspicion for any appendicitis or diverticulitis.  She has been afebrile, has been eating prior to arrival.  CMP without any electrolyte derangement, glucose is slightly elevated.  LFTs are unremarkable.  Kidney function is unremarkable.  CBC without any leukocytosis, hemoglobin is within normal limits.  UA was negative for any nitrites, leukocytes, white blood cell count, she denies any urinary symptoms.  Lipase level slightly elevated, low suspicion for any pancreatitis that she denies any previous history of alcohol use, no prior history of cholelithiasis.  Troponin x2 have been negative, low suspicion for any ACS as pain seems to be related to likely a gastritis.   Patient received lidocaine which helped some with her pain.  She reports continuous pain, received morphine along with a liter of fluids.  Discussed risks and benefits of obtaining imaging versus sending patient home.  She is agreeable to obtaining CT to further evaluate her condition.  Suspect disposition will be determined with results of the CT.  Patient signed out to Dr. Regenia Skeeter pending CT abdomen prior to disposition.   Portions of this note were generated with [employer]. Dictation errors may occur despite best  attempts at proofreading.  Final Clinical Impressions(s) / ED Diagnoses   Final diagnoses:  Epigastric pain    ED Discharge Orders  None       Janeece Fitting, Hershal Coria 04/29/19 2141    Sherwood Gambler, MD 04/29/19 [phone]

## 2019-04-29 NOTE — Discharge Instructions (Addendum)
If you develop worsening, continued, or recurrent abdominal pain, uncontrolled vomiting, fever, chest or back pain, or any other new/concerning symptoms then return to the ER for evaluation.   Continue taking the protonix.  Do not take NSAIDs such as ibuprofen, aspirin, Aleve, etc.  Do not drink alcohol.  Follow-up with PCP as needed.

## 2019-04-29 NOTE — ED Notes (Signed)
Taken to CT at this time. 

## 2019-05-01 ENCOUNTER — Encounter: Payer: Self-pay | Admitting: Internal Medicine

## 2019-05-01 ENCOUNTER — Telehealth (INDEPENDENT_AMBULATORY_CARE_PROVIDER_SITE_OTHER): Payer: 59 | Admitting: Internal Medicine

## 2019-05-01 ENCOUNTER — Telehealth: Payer: Self-pay | Admitting: Internal Medicine

## 2019-05-01 DIAGNOSIS — R110 Nausea: Secondary | ICD-10-CM | POA: Diagnosis not present

## 2019-05-01 DIAGNOSIS — R197 Diarrhea, unspecified: Secondary | ICD-10-CM | POA: Diagnosis not present

## 2019-05-01 MED ORDER — PROMETHAZINE HCL 25 MG PO TABS: 25.0000 mg | ORAL_TABLET | Freq: Three times a day (TID) | ORAL | 0 refills | Status: DC | PRN

## 2019-05-01 NOTE — Telephone Encounter (Signed)
Phone call to patient and daughter from me as requested from patient's daughter.  Patient says she is too nauseated to talk.  Spoke with daughter.  Patient has been taking Zofran 4 times daily as prescribed in emergency department and is still nauseated.  Today began having diarrhea and has had 3 episodes.  She has also been taking Pepcid and Protonix.  Daughter seems to think patient has ulcers but that was not tested for in the emergency department.  Apparently no blood in stool.  No fever or chills.  Based on urine specific gravity in the emergency department 2 days ago, she was not dehydrated at that time.  She had CT of abdomen which was negative.  Lipase was very slightly elevated.  Daughter says patient does not drink alcohol.  Patient has been under extreme stress with issue with sister and family business problems.  She tapered off of Paxil some 2 weeks ago over a period of a couple of weeks and is no longer taking it.  Did not like the way it made her feel.  Does take Xanax at bedtime for sleep and is done that for some time.  Expressed disappointment that they did not call earlier in the day.  We can certainly expected to hear from them earlier since she was in the emergency department.  Daughter says patient would not call and she called for her because she was concerned.  We are going to call in Phenergan for her now 25 mg tablets 1 p.o. every 4-6 hours as needed nausea number 12 tablets with no refill to [facility].  She is going to try to hydrate with Sprite and eat some crackers.  They will touch base with [place] tomorrow.

## 2019-05-01 NOTE — Telephone Encounter (Signed)
Pt daughter called and said that pt was seen [facility] Saturday, and she has stomach ulsers. She has extreme nase[place], mingrains, Dirhea which just started today, She hasnt been able to eat today either. Pt daughter was wondering if Dr Renold Genta could prescribe something or if they needed to go back to ER.  Pt daughter said pt has prescription for Zofran but it is not working

## 2019-05-02 ENCOUNTER — Telehealth: Payer: Self-pay | Admitting: Internal Medicine

## 2019-05-02 NOTE — Telephone Encounter (Signed)
Pt did get new nausea medicine filled and it has worked, she said she felt so much better today and wanted to let Dr Renold Genta know

## 2019-05-12 ENCOUNTER — Ambulatory Visit (INDEPENDENT_AMBULATORY_CARE_PROVIDER_SITE_OTHER): Payer: 59 | Admitting: Psychiatry

## 2019-05-12 ENCOUNTER — Other Ambulatory Visit: Payer: Self-pay

## 2019-05-12 ENCOUNTER — Encounter: Payer: Self-pay | Admitting: Psychiatry

## 2019-05-12 DIAGNOSIS — F900 Attention-deficit hyperactivity disorder, predominantly inattentive type: Secondary | ICD-10-CM | POA: Diagnosis not present

## 2019-05-12 DIAGNOSIS — F5105 Insomnia due to other mental disorder: Secondary | ICD-10-CM

## 2019-05-12 DIAGNOSIS — F411 Generalized anxiety disorder: Secondary | ICD-10-CM

## 2019-05-12 DIAGNOSIS — F4001 Agoraphobia with panic disorder: Secondary | ICD-10-CM

## 2019-05-12 DIAGNOSIS — F3342 Major depressive disorder, recurrent, in full remission: Secondary | ICD-10-CM

## 2019-05-12 DIAGNOSIS — G43009 Migraine without aura, not intractable, without status migrainosus: Secondary | ICD-10-CM

## 2019-05-12 MED ORDER — PAROXETINE HCL 30 MG PO TABS: ORAL_TABLET | ORAL | 1 refills | Status: DC
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
## 2019-06-10 ENCOUNTER — Other Ambulatory Visit: Payer: Self-pay | Admitting: Psychiatry

## 2019-06-10 DIAGNOSIS — F900 Attention-deficit hyperactivity disorder, predominantly inattentive type: Secondary | ICD-10-CM

## 2019-06-11 NOTE — Telephone Encounter (Signed)
Apt 11/11 

## 2019-06-14 ENCOUNTER — Ambulatory Visit: Payer: 59 | Admitting: Psychiatry

## 2019-07-04 ENCOUNTER — Ambulatory Visit (INDEPENDENT_AMBULATORY_CARE_PROVIDER_SITE_OTHER): Payer: 59 | Admitting: Psychiatry

## 2019-07-04 ENCOUNTER — Other Ambulatory Visit: Payer: Self-pay

## 2019-07-04 ENCOUNTER — Encounter: Payer: Self-pay | Admitting: Psychiatry

## 2019-07-04 DIAGNOSIS — Z8669 Personal history of other diseases of the nervous system and sense organs: Secondary | ICD-10-CM | POA: Diagnosis not present

## 2019-07-04 DIAGNOSIS — G43009 Migraine without aura, not intractable, without status migrainosus: Secondary | ICD-10-CM

## 2019-07-04 DIAGNOSIS — F900 Attention-deficit hyperactivity disorder, predominantly inattentive type: Secondary | ICD-10-CM | POA: Diagnosis not present

## 2019-07-04 DIAGNOSIS — F3342 Major depressive disorder, recurrent, in full remission: Secondary | ICD-10-CM

## 2019-07-04 DIAGNOSIS — F411 Generalized anxiety disorder: Secondary | ICD-10-CM

## 2019-07-04 DIAGNOSIS — F4001 Agoraphobia with panic disorder: Secondary | ICD-10-CM | POA: Diagnosis not present

## 2019-07-04 DIAGNOSIS — F5105 Insomnia due to other mental disorder: Secondary | ICD-10-CM

## 2019-07-04 MED ORDER — RIZATRIPTAN BENZOATE 10 MG PO TABS: ORAL_TABLET | ORAL | 5 refills | Status: DC

## 2019-07-04 MED ORDER — LISDEXAMFETAMINE DIMESYLATE 70 MG PO CAPS: 70.0000 mg | ORAL_CAPSULE | Freq: Every day | ORAL | 0 refills | Status: DC

## 2019-07-04 MED ORDER — AMPHETAMINE-DEXTROAMPHETAMINE 30 MG PO TABS: 15.0000 mg | ORAL_TABLET | Freq: Four times a day (QID) | ORAL | 0 refills | Status: DC

## 2019-07-04 MED ORDER — VYVANSE 70 MG PO CAPS: 70.0000 mg | ORAL_CAPSULE | Freq: Every day | ORAL | 0 refills | Status: DC

## 2019-07-04 MED ORDER — PAROXETINE HCL 30 MG PO TABS: 30.0000 mg | ORAL_TABLET | Freq: Every day | ORAL | 1 refills | Status: DC
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
## 2019-07-19 ENCOUNTER — Ambulatory Visit: Payer: 59 | Admitting: Psychiatry

## 2019-09-06 ENCOUNTER — Other Ambulatory Visit: Payer: Self-pay | Admitting: Psychiatry

## 2019-09-06 DIAGNOSIS — F900 Attention-deficit hyperactivity disorder, predominantly inattentive type: Secondary | ICD-10-CM

## 2019-09-07 NOTE — Telephone Encounter (Signed)
Already has on file for next refill

## 2019-10-03 NOTE — Telephone Encounter (Signed)
Last refill for both 09/07/2019

## 2019-10-11 ENCOUNTER — Other Ambulatory Visit: Payer: Self-pay | Admitting: Psychiatry

## 2019-10-11 DIAGNOSIS — F419 Anxiety disorder, unspecified: Secondary | ICD-10-CM

## 2019-10-11 DIAGNOSIS — F329 Major depressive disorder, single episode, unspecified: Secondary | ICD-10-CM

## 2019-10-11 DIAGNOSIS — F32A Depression, unspecified: Secondary | ICD-10-CM

## 2019-10-18 ENCOUNTER — Other Ambulatory Visit: Payer: Self-pay | Admitting: Psychiatry

## 2019-10-18 DIAGNOSIS — F32A Depression, unspecified: Secondary | ICD-10-CM

## 2019-10-18 DIAGNOSIS — F329 Major depressive disorder, single episode, unspecified: Secondary | ICD-10-CM

## 2019-10-18 DIAGNOSIS — F419 Anxiety disorder, unspecified: Secondary | ICD-10-CM

## 2019-11-02 ENCOUNTER — Other Ambulatory Visit: Payer: Self-pay | Admitting: Psychiatry

## 2019-11-02 NOTE — Telephone Encounter (Signed)
Both last filled 10/04/2019 Due back for CC in April

## 2019-11-29 ENCOUNTER — Other Ambulatory Visit: Payer: Self-pay | Admitting: Physician Assistant

## 2019-11-29 IMAGING — US US ABDOMEN COMPLETE
1 series · 14 of 25 positions shown · non-contrast
Comparison: Ultrasound abdomen 08/22/2003

CLINICAL DATA: Elevated LFT

EXAM:
ABDOMEN ULTRASOUND COMPLETE

[Series 1: us abdomen complete · 0.17mm/px · 14 of 96 slices shown]
[im 1/96]
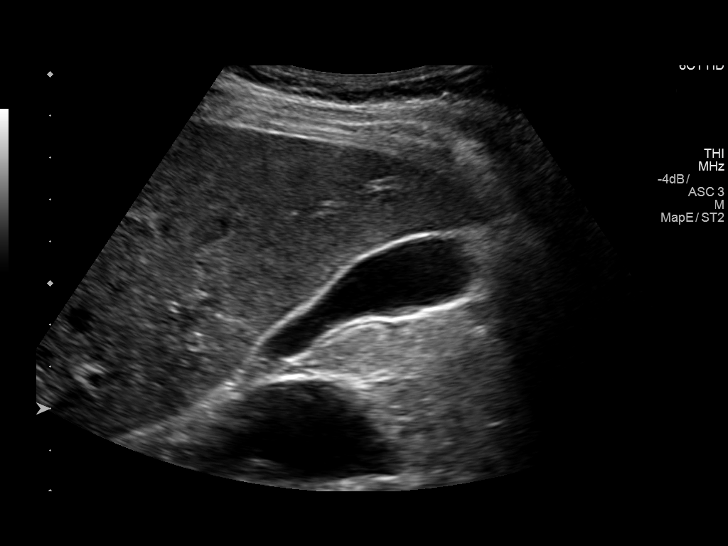
[im 8/96]
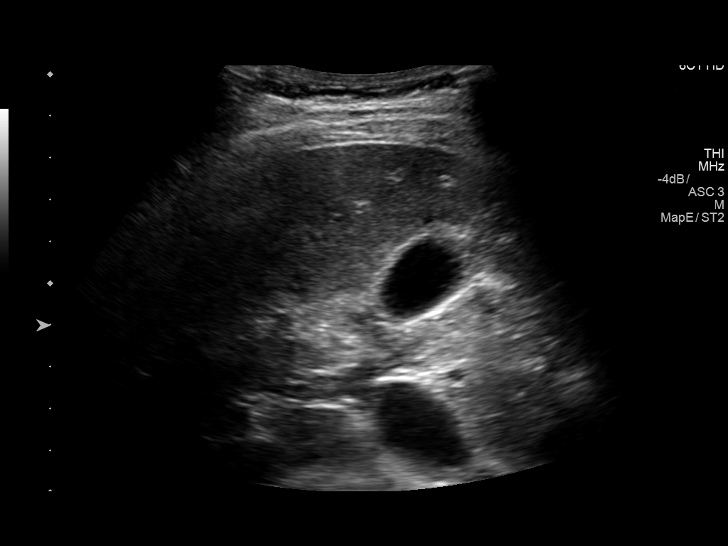
[im 16/96]
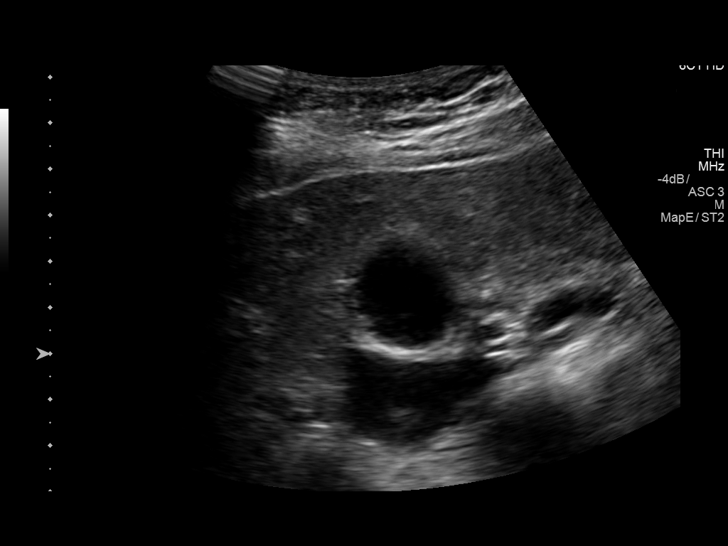
[im 24/96]
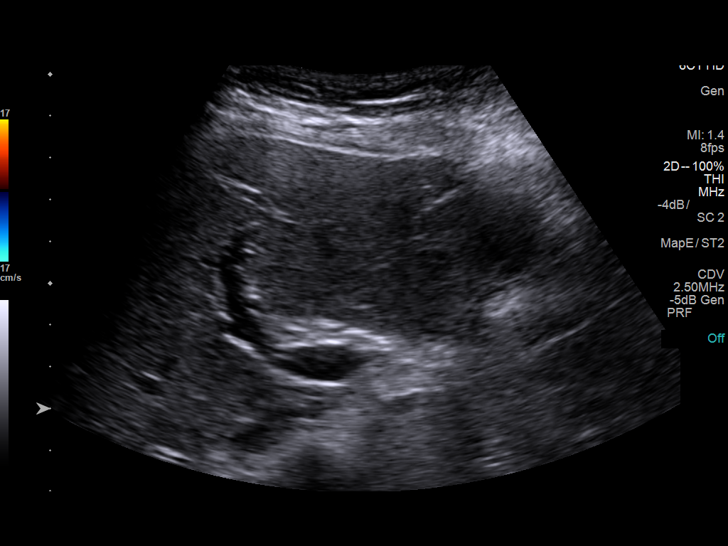
[im 32/96]
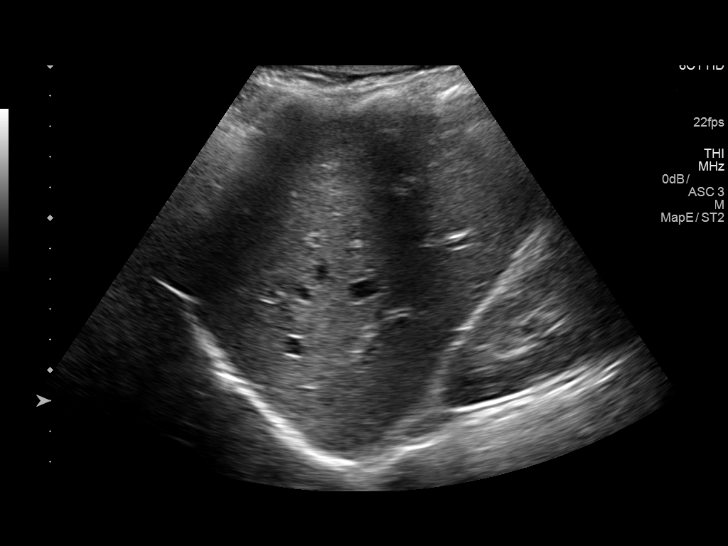
[im 36/96]
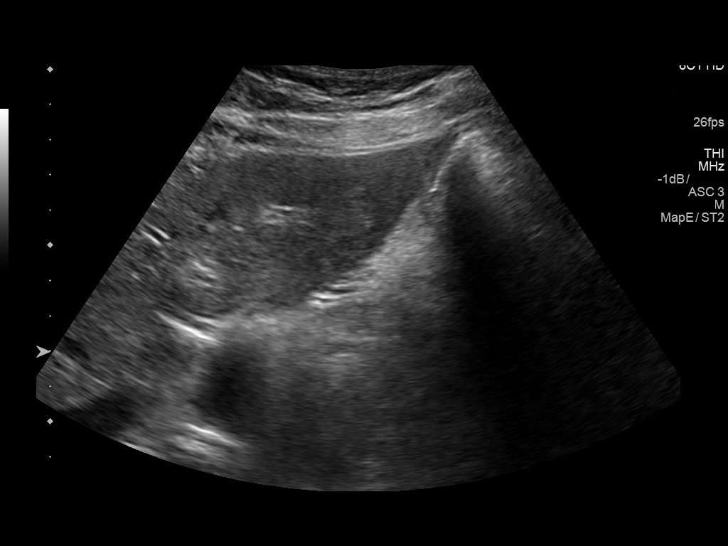
[im 44/96]
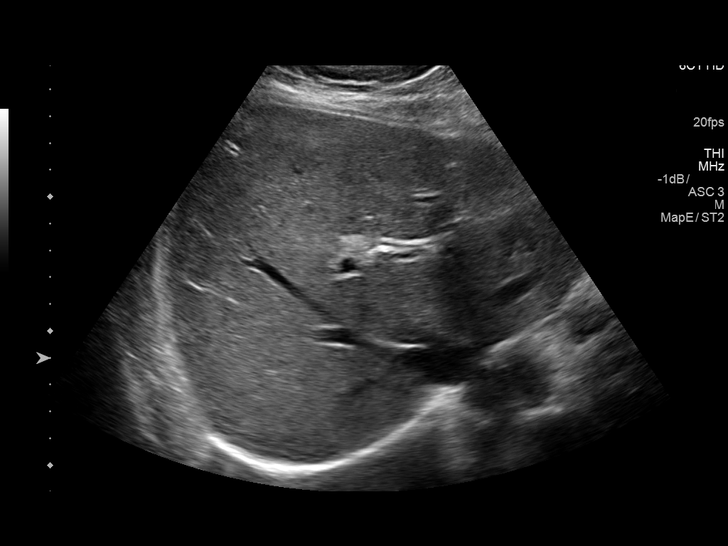
[im 52/96]
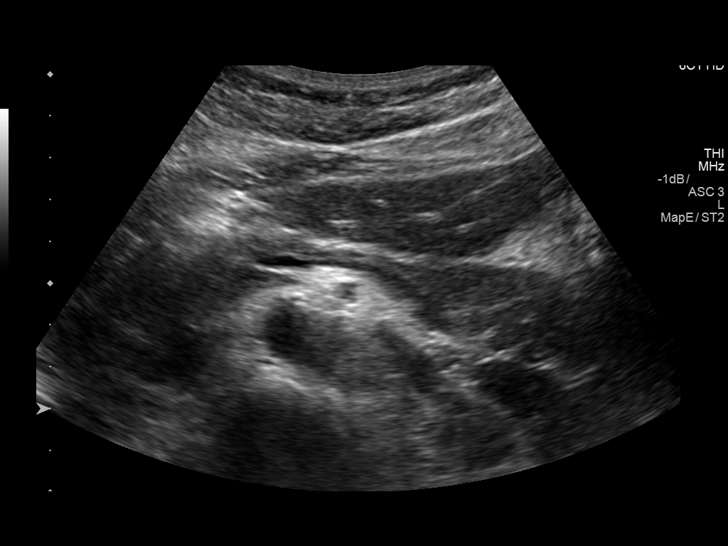
[im 60/96]
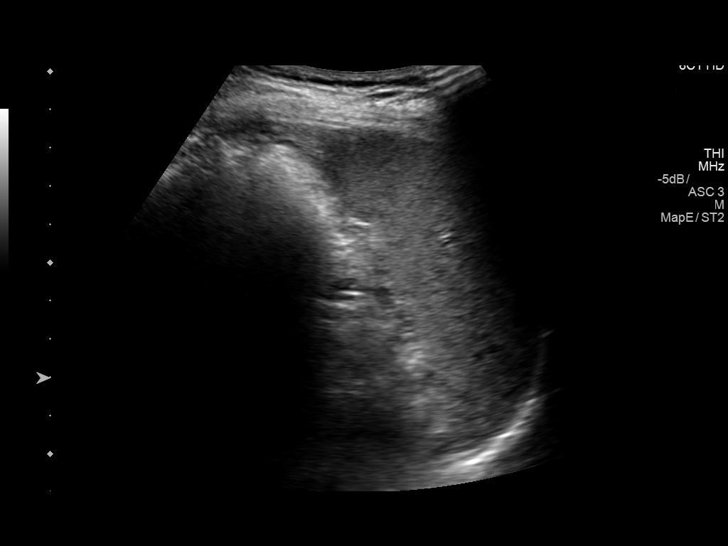
[im 64/96]
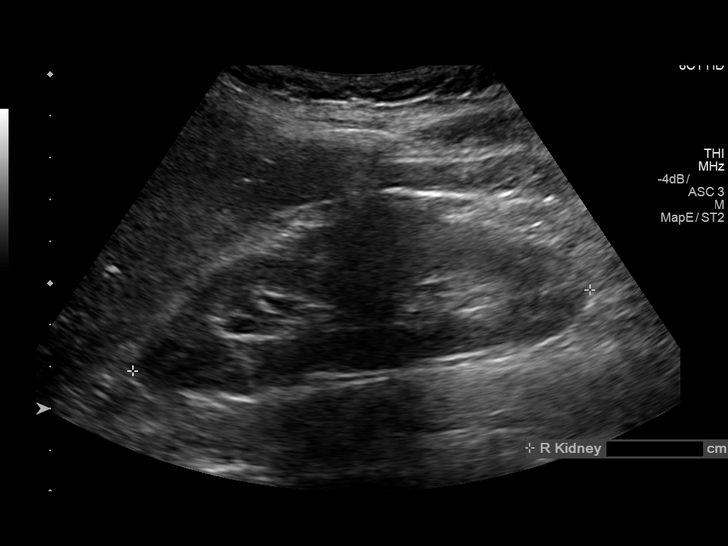
[im 72/96]
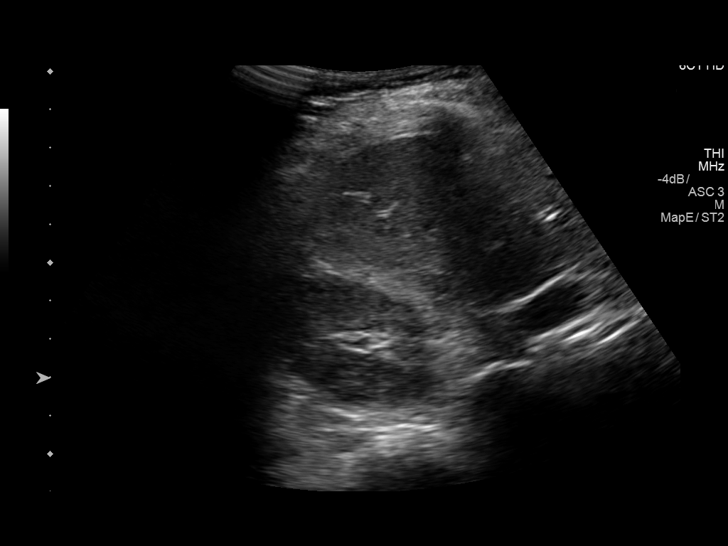
[im 80/96]
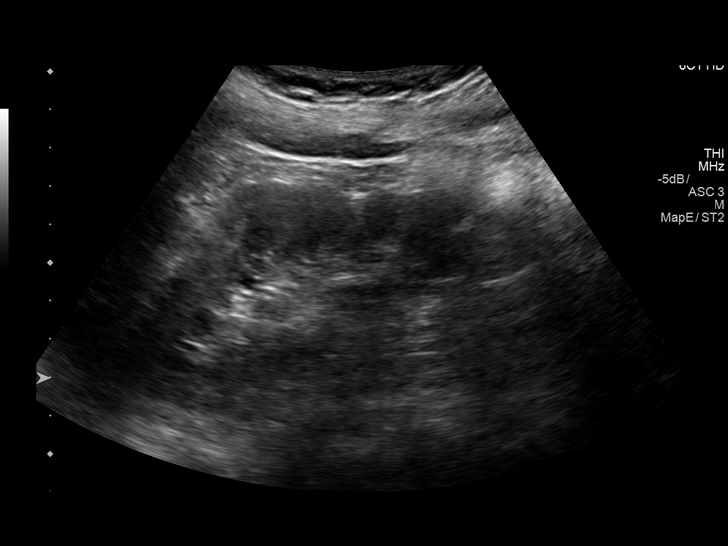
[im 88/96]
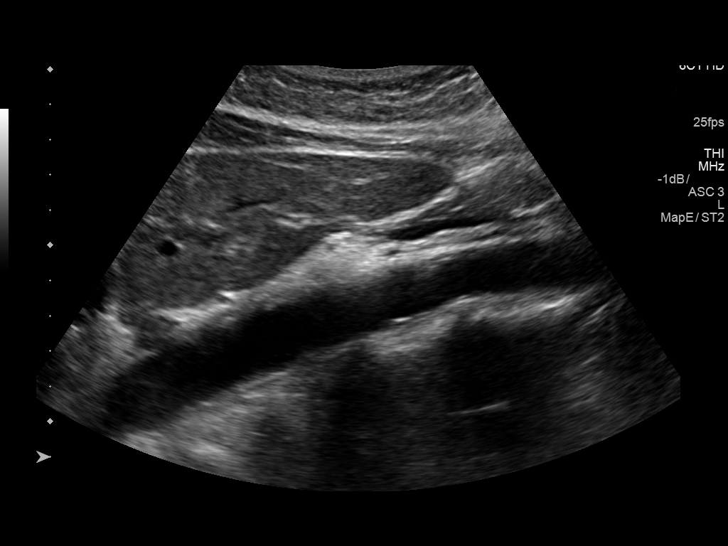
[im 96/96]
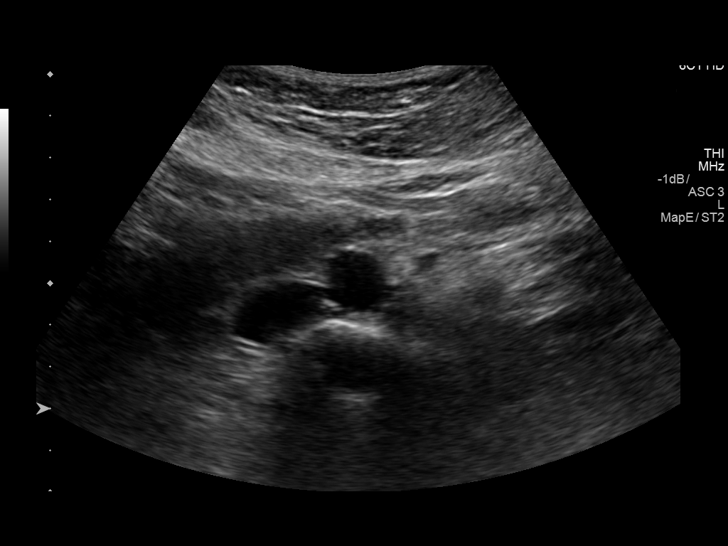

[14 of 25 positions shown; findings below may reference images not displayed]

FINDINGS: Gallbladder: No gallstones or wall thickening visualized. No
sonographic Murphy sign noted by sonographer.

Common bile duct: Diameter: 2.3 mm

Liver: No focal lesion identified. Within normal limits in
parenchymal echogenicity. Portal vein is patent on color Doppler
imaging with normal direction of blood flow towards the liver.

IVC: No abnormality visualized.

Pancreas: Visualized portion unremarkable.

Spleen: Size and appearance within normal limits.

Right Kidney: Length: 11.1 cm. Mild fullness of the right renal
pelvis. Negative for mass

Left Kidney: Length: 11.2 cm. Echogenicity within normal limits. No
mass or hydronephrosis visualized.

Abdominal aorta: No aneurysm visualized.

Other findings: None.
IMPRESSION: Negative for gallstones

Mild fullness right renal pelvis which may represent mild
hydronephrosis or parapelvic cyst.

## 2019-11-29 NOTE — Telephone Encounter (Signed)
Apt 12/08/2019

## 2019-12-05 ENCOUNTER — Other Ambulatory Visit: Payer: Self-pay | Admitting: Internal Medicine

## 2019-12-05 ENCOUNTER — Telehealth: Payer: Self-pay | Admitting: Internal Medicine

## 2019-12-05 DIAGNOSIS — Z1231 Encounter for screening mammogram for malignant neoplasm of breast: Secondary | ICD-10-CM

## 2019-12-05 NOTE — Telephone Encounter (Signed)
Dr. Valentino Saxon at [facility] is her GYN physician. We can see her but may need referral there.

## 2019-12-05 NOTE — Telephone Encounter (Signed)
Appointment scheduled for Monday for abdominal pain only, first day patient could come.

## 2019-12-05 NOTE — Telephone Encounter (Signed)
Cimberly Uresti [phone]  Josline called to see if she could come and be seen she is having lower abdominal pain, with some clear discharge.

## 2019-12-08 ENCOUNTER — Other Ambulatory Visit: Payer: Self-pay

## 2019-12-08 ENCOUNTER — Encounter: Payer: Self-pay | Admitting: Psychiatry

## 2019-12-08 ENCOUNTER — Ambulatory Visit (INDEPENDENT_AMBULATORY_CARE_PROVIDER_SITE_OTHER): Payer: 59 | Admitting: Psychiatry

## 2019-12-08 DIAGNOSIS — F411 Generalized anxiety disorder: Secondary | ICD-10-CM | POA: Diagnosis not present

## 2019-12-08 DIAGNOSIS — G43009 Migraine without aura, not intractable, without status migrainosus: Secondary | ICD-10-CM

## 2019-12-08 DIAGNOSIS — G4710 Hypersomnia, unspecified: Secondary | ICD-10-CM

## 2019-12-08 DIAGNOSIS — F5105 Insomnia due to other mental disorder: Secondary | ICD-10-CM

## 2019-12-08 DIAGNOSIS — F900 Attention-deficit hyperactivity disorder, predominantly inattentive type: Secondary | ICD-10-CM | POA: Diagnosis not present

## 2019-12-08 DIAGNOSIS — F3342 Major depressive disorder, recurrent, in full remission: Secondary | ICD-10-CM

## 2019-12-08 DIAGNOSIS — F4001 Agoraphobia with panic disorder: Secondary | ICD-10-CM | POA: Diagnosis not present

## 2019-12-08 DIAGNOSIS — Z8669 Personal history of other diseases of the nervous system and sense organs: Secondary | ICD-10-CM

## 2019-12-08 MED ORDER — MODAFINIL 200 MG PO TABS: 200.0000 mg | ORAL_TABLET | Freq: Every day | ORAL | 1 refills | Status: DC

## 2019-12-08 NOTE — Patient Instructions (Addendum)
Disc the off-label use of N-Acetylcysteine at 600 mg daily to help with mild cognitive problems.  It can be combined with a B-complex vitamin as the B-12 and folate have been shown to sometimes enhance the effect. [email or web address]  Start modafinil 200 mg tablet 1/2 tablet for 6 days then 1 daily. Reduce Adderall to 1 and 1/2 tablets daily for 1 week then 1 tablet daily for a week then 1/2 tablet daily for a week then stop it.
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
## 2019-12-11 ENCOUNTER — Ambulatory Visit: Payer: Self-pay | Admitting: Internal Medicine

## 2020-01-02 ENCOUNTER — Other Ambulatory Visit: Payer: Self-pay

## 2020-01-02 ENCOUNTER — Telehealth: Payer: Self-pay

## 2020-01-02 ENCOUNTER — Encounter: Payer: Self-pay | Admitting: Internal Medicine

## 2020-01-02 ENCOUNTER — Ambulatory Visit: Payer: Self-pay | Admitting: Internal Medicine

## 2020-01-02 ENCOUNTER — Telehealth (INDEPENDENT_AMBULATORY_CARE_PROVIDER_SITE_OTHER): Payer: 59 | Admitting: Internal Medicine

## 2020-01-02 VITALS — Ht 64.0 in

## 2020-01-02 DIAGNOSIS — J0100 Acute maxillary sinusitis, unspecified: Secondary | ICD-10-CM | POA: Diagnosis not present

## 2020-01-02 DIAGNOSIS — H6500 Acute serous otitis media, unspecified ear: Secondary | ICD-10-CM

## 2020-01-02 MED ORDER — DOXYCYCLINE HYCLATE 100 MG PO TABS: 100.0000 mg | ORAL_TABLET | Freq: Two times a day (BID) | ORAL | 0 refills | Status: DC

## 2020-01-02 NOTE — Telephone Encounter (Signed)
Scheduled at 12:30pm. 

## 2020-01-02 NOTE — Progress Notes (Signed)
   Subjective:    Patient ID: Molly Brady, female    DOB: [birth year], 59 y.o.   MRN: [id number]  HPI 59 year old Female seen today by interactive audio and video telecommunications due to the coronavirus pandemic.  Patient is agreeable to visit in this format today.  She is identified using 2 identifiers as Molly Brady. Jenne Campus, a patient in this practice.  Patient reports that she had onset of sore throat May 27 and 28.  She had a virtual visit with a teledoc who prescribed an inhaler.  Patient developed head congestion, headache and temperature of 101.2 degrees on Saturday, May 29.  No known COVID-19 exposure recently that patient is aware of.  She has  had COVID-19 immunizations.  No dysgeusia.  No myalgias.  Complaining of ear pain and dizziness  History of attention deficit disorder treated by Dr. Clovis Pu.  History of anxiety treated with Xanax and also takes Wellbutrin.  Dr. Dina Rich also has her on gabapentin  Review of Systems see above-no vomiting     Objective:   Physical Exam Patient seen virtually.  Looks to be pale and is nasally congested.  Complaining of headache and head congestion.  Also complaining of ear pain.       Assessment & Plan:  Acute maxillary sinusitis-none recurrent  Acute otitis media  Plan: Doxycycline 100 mg twice daily for 10 days.  Rest and drink plenty of fluids.  Tylenol if needed for fever.  Call if not improving in 48 hours or sooner if worse.

## 2020-01-02 NOTE — Telephone Encounter (Signed)
Can do video visit

## 2020-01-02 NOTE — Telephone Encounter (Signed)
Patient called this morning wanting to know if she could have a video visit today?   She thinks she has a sinus infection. Ear pain and dizzy, with fever.  Patient has been fully vaccinated for Covid.  Patient can be contacted at [phone].  Thank you

## 2020-01-03 ENCOUNTER — Other Ambulatory Visit: Payer: Self-pay | Admitting: Psychiatry

## 2020-01-03 NOTE — Telephone Encounter (Signed)
Last apt 05/07

## 2020-01-08 ENCOUNTER — Telehealth: Payer: Self-pay

## 2020-01-08 NOTE — Telephone Encounter (Signed)
Patient called hasn't had a period for 7-8 years and she started bleeding this morning she said it feels like a period she has low abdominal discomfort and she would like to be seen please advise. She said she does not have a GYN.

## 2020-01-08 NOTE — Telephone Encounter (Signed)
This will require GYN to see her and determine next steps. She may call GYN of her choice.

## 2020-01-08 NOTE — Telephone Encounter (Signed)
She said she will schedule an appointment with her old GYN.

## 2020-01-15 ENCOUNTER — Telehealth: Payer: Self-pay | Admitting: Psychiatry

## 2020-01-15 ENCOUNTER — Other Ambulatory Visit: Payer: Self-pay | Admitting: Psychiatry

## 2020-01-15 DIAGNOSIS — F900 Attention-deficit hyperactivity disorder, predominantly inattentive type: Secondary | ICD-10-CM

## 2020-01-15 MED ORDER — AMPHETAMINE-DEXTROAMPHETAMINE 30 MG PO TABS: 15.0000 mg | ORAL_TABLET | Freq: Four times a day (QID) | ORAL | 0 refills | Status: DC

## 2020-01-15 NOTE — Telephone Encounter (Signed)
You have weaned Molly Brady off the Adderall and started her on Provigil.  She reports the Provigil doesn't work so she isn't taking it.  She is struggling since she isn't taking either medication. Has appt 6/21.  Will you call in enough Adderall to get her to the appt?  Then you can discuss new plans/medications with her at the appt.

## 2020-01-15 NOTE — Telephone Encounter (Signed)
Please let her know since she is off Provigil I sent in the prescription she requested for Adderall.

## 2020-01-16 NOTE — Telephone Encounter (Signed)
LM letting Linzey know that the prescription has been sent in as requested.

## 2020-01-22 ENCOUNTER — Ambulatory Visit (INDEPENDENT_AMBULATORY_CARE_PROVIDER_SITE_OTHER): Payer: 59 | Admitting: Psychiatry

## 2020-01-22 ENCOUNTER — Encounter: Payer: Self-pay | Admitting: Psychiatry

## 2020-01-22 ENCOUNTER — Other Ambulatory Visit: Payer: Self-pay

## 2020-01-22 DIAGNOSIS — Z8669 Personal history of other diseases of the nervous system and sense organs: Secondary | ICD-10-CM

## 2020-01-22 DIAGNOSIS — F411 Generalized anxiety disorder: Secondary | ICD-10-CM

## 2020-01-22 DIAGNOSIS — F329 Major depressive disorder, single episode, unspecified: Secondary | ICD-10-CM

## 2020-01-22 DIAGNOSIS — F3342 Major depressive disorder, recurrent, in full remission: Secondary | ICD-10-CM | POA: Diagnosis not present

## 2020-01-22 DIAGNOSIS — F32A Depression, unspecified: Secondary | ICD-10-CM

## 2020-01-22 DIAGNOSIS — F4001 Agoraphobia with panic disorder: Secondary | ICD-10-CM | POA: Diagnosis not present

## 2020-01-22 DIAGNOSIS — F900 Attention-deficit hyperactivity disorder, predominantly inattentive type: Secondary | ICD-10-CM

## 2020-01-22 DIAGNOSIS — F419 Anxiety disorder, unspecified: Secondary | ICD-10-CM

## 2020-01-22 MED ORDER — AMPHETAMINE-DEXTROAMPHETAMINE 30 MG PO TABS: 30.0000 mg | ORAL_TABLET | Freq: Three times a day (TID) | ORAL | 0 refills | Status: DC

## 2020-01-22 MED ORDER — BUPROPION HCL ER (XL) 150 MG PO TB24: ORAL_TABLET | ORAL | 0 refills | Status: DC

## 2020-01-22 MED ORDER — GABAPENTIN 100 MG PO CAPS: 200.0000 mg | ORAL_CAPSULE | Freq: Every day | ORAL | 2 refills | Status: DC

## 2020-01-22 MED ORDER — ALPRAZOLAM 0.5 MG PO TABS: 0.5000 mg | ORAL_TABLET | Freq: Every evening | ORAL | 1 refills | Status: DC | PRN
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
## 2020-01-24 NOTE — Patient Instructions (Signed)
Take Tylenol as needed for fever and headache pain.  Start doxycycline 100 mg twice daily for 10 days.  Rest and drink plenty of fluids.  Call if symptoms not improving in 48 hours or sooner if worse.

## 2020-02-07 ENCOUNTER — Other Ambulatory Visit: Payer: Self-pay | Admitting: Psychiatry

## 2020-02-08 ENCOUNTER — Other Ambulatory Visit: Payer: Self-pay | Admitting: Obstetrics and Gynecology

## 2020-02-08 NOTE — Telephone Encounter (Signed)
Patient called and said that she is out of her medicine. Please fill today

## 2020-02-12 ENCOUNTER — Other Ambulatory Visit: Payer: Self-pay

## 2020-02-12 ENCOUNTER — Other Ambulatory Visit: Payer: 59 | Admitting: Internal Medicine

## 2020-02-12 DIAGNOSIS — F900 Attention-deficit hyperactivity disorder, predominantly inattentive type: Secondary | ICD-10-CM

## 2020-02-12 DIAGNOSIS — E7800 Pure hypercholesterolemia, unspecified: Secondary | ICD-10-CM

## 2020-02-12 DIAGNOSIS — Z1321 Encounter for screening for nutritional disorder: Secondary | ICD-10-CM

## 2020-02-12 DIAGNOSIS — F32A Depression, unspecified: Secondary | ICD-10-CM

## 2020-02-12 DIAGNOSIS — Z1329 Encounter for screening for other suspected endocrine disorder: Secondary | ICD-10-CM

## 2020-02-12 DIAGNOSIS — F419 Anxiety disorder, unspecified: Secondary | ICD-10-CM

## 2020-02-12 DIAGNOSIS — Z0000 Encounter for general adult medical examination without abnormal findings: Secondary | ICD-10-CM

## 2020-02-12 LAB — COMPLETE METABOLIC PANEL WITH GFR
AG Ratio: 1.5 (calc) (ref 1.0–2.5)
ALT: 37 U/L — ABNORMAL HIGH (ref 6–29)
AST: 31 U/L (ref 10–35)
Albumin: 4.3 g/dL (ref 3.6–5.1)
Alkaline phosphatase (APISO): 111 U/L (ref 37–153)
BUN: 17 mg/dL (ref 7–25)
CO2: 28 mmol/L (ref 20–32)
Calcium: 9.2 mg/dL (ref 8.6–10.4)
Chloride: 105 mmol/L (ref 98–110)
Creat: 0.91 mg/dL (ref 0.50–1.05)
GFR, Est African American: 81 mL/min/{1.73_m2} (ref >=60)
GFR, Est Non African American: 70 mL/min/{1.73_m2} (ref >=60)
Globulin: 2.8 g/dL (calc) (ref 1.9–3.7)
Glucose, Bld: 108 mg/dL — ABNORMAL HIGH (ref 65–99)
Potassium: 4.4 mmol/L (ref 3.5–5.3)
Sodium: 138 mmol/L (ref 135–146)
Total Bilirubin: 0.4 mg/dL (ref 0.2–1.2)
Total Protein: 7.1 g/dL (ref 6.1–8.1)

## 2020-02-12 LAB — LIPID PANEL
Cholesterol: 258 mg/dL — ABNORMAL HIGH (ref <200)
HDL: 78 mg/dL (ref >=50)
LDL Cholesterol (Calc): 161 mg/dL (calc) — ABNORMAL HIGH
Non-HDL Cholesterol (Calc): 180 mg/dL (calc) — ABNORMAL HIGH (ref <130)
Total CHOL/HDL Ratio: 3.3 (calc) (ref <5.0)
Triglycerides: 84 mg/dL (ref <150)

## 2020-02-12 LAB — CBC WITH DIFFERENTIAL/PLATELET
Absolute Monocytes: 300 cells/uL (ref 200–950)
Basophils Absolute: 40 cells/uL (ref 0–200)
Basophils Relative: 0.8 %
Eosinophils Absolute: 90 cells/uL (ref 15–500)
Eosinophils Relative: 1.8 %
HCT: 40.6 % (ref 35.0–45.0)
Hemoglobin: 13.3 g/dL (ref 11.7–15.5)
Lymphs Abs: 1640 cells/uL (ref 850–3900)
MCH: 29.8 pg (ref 27.0–33.0)
MCHC: 32.8 g/dL (ref 32.0–36.0)
MCV: 91 fL (ref 80.0–100.0)
MPV: 8.8 fL (ref 7.5–12.5)
Monocytes Relative: 6 %
Neutro Abs: 2930 cells/uL (ref 1500–7800)
Neutrophils Relative %: 58.6 %
Platelets: 327 10*3/uL (ref 140–400)
RBC: 4.46 10*6/uL (ref 3.80–5.10)
RDW: 12.7 % (ref 11.0–15.0)
Total Lymphocyte: 32.8 %
WBC: 5 10*3/uL (ref 3.8–10.8)

## 2020-02-12 LAB — VITAMIN D 25 HYDROXY (VIT D DEFICIENCY, FRACTURES): Vit D, 25-Hydroxy: 30 ng/mL (ref 30–100)

## 2020-02-12 LAB — TSH: TSH: 1.36 mIU/L (ref 0.40–4.50)

## 2020-02-15 ENCOUNTER — Telehealth: Payer: Self-pay | Admitting: Psychiatry

## 2020-02-15 NOTE — Telephone Encounter (Signed)
Pt stated per conversation with Dr Clovis Pu if the Wellbutrin was working it could be increased. She would like to go forward with that. She uses the [facility]. Next scheduled appt is 8/25.

## 2020-02-15 NOTE — Telephone Encounter (Signed)
LM for her to call back with what dose of Wellbutrin XL she's currently taking.

## 2020-02-16 ENCOUNTER — Other Ambulatory Visit: Payer: Self-pay | Admitting: Psychiatry

## 2020-02-16 ENCOUNTER — Other Ambulatory Visit: Payer: Self-pay

## 2020-02-16 ENCOUNTER — Encounter (HOSPITAL_BASED_OUTPATIENT_CLINIC_OR_DEPARTMENT_OTHER): Payer: Self-pay | Admitting: Obstetrics and Gynecology

## 2020-02-16 MED ORDER — BUPROPION HCL ER (XL) 150 MG PO TB24: 450.0000 mg | ORAL_TABLET | Freq: Every day | ORAL | 1 refills | Status: DC

## 2020-02-16 NOTE — Telephone Encounter (Signed)
She has not been at the full dosage of 300 mg daily long enough to get the complete effect which is at least 3 weeks but she is close.  If she tolerates it well she can increase to 450 mg daily.  If gets tremor or jittery then reduce it back to 300 mg daily and give it more time.

## 2020-02-16 NOTE — Telephone Encounter (Signed)
Patient notified and given instructions. No additional questions or concerns.

## 2020-02-16 NOTE — Progress Notes (Addendum)
Spoke w/ via phone for pre-op interview---pt Lab needs dos----   cbc            Lab results------none COVID test ------02-24-2020 at [facility] at -------830 am 02-27-2020 NPO after MN NO Solid Food.  Clear liquids from MN until---730 am then npo Medications to take morning of surgery -----bupropion Diabetic medication -----n/a Patient Special Instructions -----none Pre-Op special Istructions -----none Patient verbalized understanding of instructions that were given at this phone interview. Patient denies shortness of breath, chest pain, fever, cough a this phone interview.

## 2020-02-16 NOTE — Telephone Encounter (Signed)
RX sent for Wellbutrin XL 150 mg tablets 3 each am

## 2020-02-20 ENCOUNTER — Other Ambulatory Visit: Payer: Self-pay

## 2020-02-20 ENCOUNTER — Ambulatory Visit (INDEPENDENT_AMBULATORY_CARE_PROVIDER_SITE_OTHER): Payer: 59 | Admitting: Internal Medicine

## 2020-02-20 ENCOUNTER — Encounter: Payer: Self-pay | Admitting: Internal Medicine

## 2020-02-20 VITALS — BP 120/80 | HR 94 | Ht 64.0 in | Wt 153.0 lb

## 2020-02-20 DIAGNOSIS — R635 Abnormal weight gain: Secondary | ICD-10-CM | POA: Diagnosis not present

## 2020-02-20 DIAGNOSIS — F329 Major depressive disorder, single episode, unspecified: Secondary | ICD-10-CM

## 2020-02-20 DIAGNOSIS — N939 Abnormal uterine and vaginal bleeding, unspecified: Secondary | ICD-10-CM

## 2020-02-20 DIAGNOSIS — Z0000 Encounter for general adult medical examination without abnormal findings: Secondary | ICD-10-CM

## 2020-02-20 DIAGNOSIS — F419 Anxiety disorder, unspecified: Secondary | ICD-10-CM | POA: Diagnosis not present

## 2020-02-20 DIAGNOSIS — F988 Other specified behavioral and emotional disorders with onset usually occurring in childhood and adolescence: Secondary | ICD-10-CM

## 2020-02-20 DIAGNOSIS — E7800 Pure hypercholesterolemia, unspecified: Secondary | ICD-10-CM

## 2020-02-20 DIAGNOSIS — F32A Depression, unspecified: Secondary | ICD-10-CM

## 2020-02-20 LAB — POCT URINALYSIS DIPSTICK
Appearance: NEGATIVE
Bilirubin, UA: NEGATIVE
Blood, UA: NEGATIVE
Glucose, UA: NEGATIVE
Ketones, UA: NEGATIVE
Leukocytes, UA: NEGATIVE
Nitrite, UA: NEGATIVE
Odor: NEGATIVE
Protein, UA: NEGATIVE
Spec Grav, UA: 1.01 (ref 1.010–1.025)
Urobilinogen, UA: 0.2 E.U./dL
pH, UA: 6.5 (ref 5.0–8.0)

## 2020-02-20 NOTE — Patient Instructions (Signed)
Watch diet, exercise and lose some weight. Watch fatty foods. Have lipid panel rechecked in 6- 12 months. It was a pleasure to see you today.
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
## 2020-02-24 ENCOUNTER — Other Ambulatory Visit (HOSPITAL_COMMUNITY)
Admission: RE | Admit: 2020-02-24 | Discharge: 2020-02-24 | Disposition: A | Discharge status: Home / Self Care | Payer: 59 | Source: Ambulatory Visit | Attending: Obstetrics and Gynecology | Admitting: Obstetrics and Gynecology

## 2020-02-24 DIAGNOSIS — Z01812 Encounter for preprocedural laboratory examination: Secondary | ICD-10-CM | POA: Insufficient documentation

## 2020-02-24 DIAGNOSIS — Z20822 Contact with and (suspected) exposure to covid-19: Secondary | ICD-10-CM | POA: Diagnosis not present

## 2020-02-24 LAB — SARS CORONAVIRUS 2 (TAT 6-24 HRS): SARS Coronavirus 2: NEGATIVE

## 2020-02-27 ENCOUNTER — Encounter (HOSPITAL_BASED_OUTPATIENT_CLINIC_OR_DEPARTMENT_OTHER): Payer: Self-pay | Admitting: Obstetrics and Gynecology

## 2020-02-27 ENCOUNTER — Other Ambulatory Visit: Payer: Self-pay

## 2020-02-27 ENCOUNTER — Encounter (HOSPITAL_BASED_OUTPATIENT_CLINIC_OR_DEPARTMENT_OTHER)
Admission: RE | Disposition: A | Discharge status: Home / Self Care | Payer: Self-pay | Source: Ambulatory Visit | Attending: Obstetrics and Gynecology

## 2020-02-27 ENCOUNTER — Ambulatory Visit (HOSPITAL_BASED_OUTPATIENT_CLINIC_OR_DEPARTMENT_OTHER): Payer: 59 | Admitting: Anesthesiology

## 2020-02-27 ENCOUNTER — Ambulatory Visit (HOSPITAL_BASED_OUTPATIENT_CLINIC_OR_DEPARTMENT_OTHER)
Admission: RE | Admit: 2020-02-27 | Discharge: 2020-02-27 | Disposition: A | Discharge status: Home / Self Care | Payer: 59 | Source: Ambulatory Visit | Attending: Obstetrics and Gynecology | Admitting: Obstetrics and Gynecology

## 2020-02-27 DIAGNOSIS — Z79899 Other long term (current) drug therapy: Secondary | ICD-10-CM | POA: Diagnosis not present

## 2020-02-27 DIAGNOSIS — C541 Malignant neoplasm of endometrium: Secondary | ICD-10-CM | POA: Insufficient documentation

## 2020-02-27 DIAGNOSIS — F329 Major depressive disorder, single episode, unspecified: Secondary | ICD-10-CM | POA: Diagnosis not present

## 2020-02-27 DIAGNOSIS — N950 Postmenopausal bleeding: Secondary | ICD-10-CM | POA: Diagnosis not present

## 2020-02-27 DIAGNOSIS — F419 Anxiety disorder, unspecified: Secondary | ICD-10-CM | POA: Diagnosis not present

## 2020-02-27 DIAGNOSIS — N840 Polyp of corpus uteri: Secondary | ICD-10-CM | POA: Diagnosis not present

## 2020-02-27 DIAGNOSIS — F909 Attention-deficit hyperactivity disorder, unspecified type: Secondary | ICD-10-CM | POA: Insufficient documentation

## 2020-02-27 HISTORY — DX: Polyp of corpus uteri: N84.0

## 2020-02-27 HISTORY — DX: Anemia, unspecified: D64.9

## 2020-02-27 HISTORY — PX: DILATATION & CURETTAGE/HYSTEROSCOPY WITH MYOSURE: SHX6511

## 2020-02-27 SURGERY — DILATATION & CURETTAGE/HYSTEROSCOPY WITH MYOSURE
Anesthesia: General | Site: Vagina

## 2020-02-27 MED ORDER — PROMETHAZINE HCL 25 MG/ML IJ SOLN: 6.2500 mg | INTRAMUSCULAR | Status: DC | PRN

## 2020-02-27 MED ORDER — MIDAZOLAM HCL 5 MG/5ML IJ SOLN
INTRAMUSCULAR | Status: DC | PRN
  Administered 2020-02-27: 2 mg via INTRAVENOUS

## 2020-02-27 MED ORDER — LIDOCAINE 2% (20 MG/ML) 5 ML SYRINGE
INTRAMUSCULAR | Status: AC
  Filled 2020-02-27: qty 5

## 2020-02-27 MED ORDER — PROPOFOL 10 MG/ML IV BOLUS
INTRAVENOUS | Status: AC
  Filled 2020-02-27: qty 20

## 2020-02-27 MED ORDER — FENTANYL CITRATE (PF) 100 MCG/2ML IJ SOLN
INTRAMUSCULAR | Status: AC
  Filled 2020-02-27: qty 2

## 2020-02-27 MED ORDER — PROPOFOL 10 MG/ML IV BOLUS
INTRAVENOUS | Status: DC | PRN
  Administered 2020-02-27: 170 mg via INTRAVENOUS

## 2020-02-27 MED ORDER — CELECOXIB 200 MG PO CAPS
200.0000 mg | ORAL_CAPSULE | Freq: Once | ORAL | Status: AC
  Administered 2020-02-27: 200 mg via ORAL

## 2020-02-27 MED ORDER — DEXAMETHASONE SODIUM PHOSPHATE 10 MG/ML IJ SOLN
INTRAMUSCULAR | Status: AC
  Filled 2020-02-27: qty 1

## 2020-02-27 MED ORDER — LACTATED RINGERS IV SOLN: INTRAVENOUS | Status: DC

## 2020-02-27 MED ORDER — CELECOXIB 200 MG PO CAPS
ORAL_CAPSULE | ORAL | Status: AC
  Filled 2020-02-27: qty 1

## 2020-02-27 MED ORDER — ACETAMINOPHEN 500 MG PO TABS
ORAL_TABLET | ORAL | Status: AC
  Filled 2020-02-27: qty 2

## 2020-02-27 MED ORDER — FENTANYL CITRATE (PF) 100 MCG/2ML IJ SOLN
INTRAMUSCULAR | Status: DC | PRN
  Administered 2020-02-27 (×2): 25 ug via INTRAVENOUS
  Administered 2020-02-27: 50 ug via INTRAVENOUS

## 2020-02-27 MED ORDER — DEXAMETHASONE SODIUM PHOSPHATE 10 MG/ML IJ SOLN
INTRAMUSCULAR | Status: DC | PRN
  Administered 2020-02-27: 10 mg via INTRAVENOUS

## 2020-02-27 MED ORDER — ONDANSETRON HCL 4 MG/2ML IJ SOLN
INTRAMUSCULAR | Status: DC | PRN
  Administered 2020-02-27: 4 mg via INTRAVENOUS

## 2020-02-27 MED ORDER — ACETAMINOPHEN 500 MG PO TABS
1000.0000 mg | ORAL_TABLET | Freq: Once | ORAL | Status: AC
  Administered 2020-02-27: 1000 mg via ORAL

## 2020-02-27 MED ORDER — IBUPROFEN 800 MG PO TABS: 800.0000 mg | ORAL_TABLET | Freq: Three times a day (TID) | ORAL | 0 refills | Status: DC | PRN

## 2020-02-27 MED ORDER — LIDOCAINE 2% (20 MG/ML) 5 ML SYRINGE
INTRAMUSCULAR | Status: DC | PRN
  Administered 2020-02-27: 80 mg via INTRAVENOUS

## 2020-02-27 MED ORDER — SODIUM CHLORIDE 0.9 % IR SOLN
Status: DC | PRN
  Administered 2020-02-27: 1000 mL

## 2020-02-27 MED ORDER — MIDAZOLAM HCL 2 MG/2ML IJ SOLN
INTRAMUSCULAR | Status: AC
  Filled 2020-02-27: qty 2

## 2020-02-27 MED ORDER — FENTANYL CITRATE (PF) 100 MCG/2ML IJ SOLN: 25.0000 ug | INTRAMUSCULAR | Status: DC | PRN

## 2020-02-27 MED ORDER — ONDANSETRON HCL 4 MG/2ML IJ SOLN
INTRAMUSCULAR | Status: AC
  Filled 2020-02-27: qty 2

## 2020-02-27 SURGICAL SUPPLY — 17 items
CATH ROBINSON RED A/P 16FR (CATHETERS) ×2 IMPLANT
DEVICE MYOSURE LITE (MISCELLANEOUS) IMPLANT
DEVICE MYOSURE REACH (MISCELLANEOUS) ×2 IMPLANT
GLOVE BIOGEL PI IND STRL 6.5 (GLOVE) ×1 IMPLANT
GLOVE BIOGEL PI INDICATOR 6.5 (GLOVE) ×1
GLOVE ECLIPSE 6.0 STRL STRAW (GLOVE) ×2 IMPLANT
GOWN STRL REUS W/TWL LRG LVL3 (GOWN DISPOSABLE) ×4 IMPLANT
HIBICLENS CHG 4% 4OZ (MISCELLANEOUS) IMPLANT
IV NS IRRIG 3000ML ARTHROMATIC (IV SOLUTION) ×2 IMPLANT
KIT PROCEDURE FLUENT (KITS) ×2 IMPLANT
MYOSURE XL FIBROID REM (MISCELLANEOUS)
PACK VAGINAL MINOR WOMEN LF (CUSTOM PROCEDURE TRAY) ×2 IMPLANT
PAD OB MATERNITY 4.3X12.25 (PERSONAL CARE ITEMS) ×2 IMPLANT
SEAL CERVICAL OMNI LOK (ABLATOR) IMPLANT
SEAL ROD LENS SCOPE MYOSURE (ABLATOR) ×2 IMPLANT
SYSTEM TISS REMOVAL MYSR XL RM (MISCELLANEOUS) IMPLANT
TOWEL OR 17X26 10 PK STRL BLUE (TOWEL DISPOSABLE) ×2 IMPLANT

## 2020-02-27 NOTE — Anesthesia Preprocedure Evaluation (Addendum)
Anesthesia Evaluation  Patient identified by MRN, date of birth, ID band Patient awake    Reviewed: Allergy & Precautions, NPO status , Patient's Chart, lab work & pertinent test results  History of Anesthesia Complications Negative for: history of anesthetic complications  Airway Mallampati: II  TM Distance: >3 FB Neck ROM: Full    Dental no notable dental hx. (+) Dental Advisory Given   Pulmonary neg pulmonary ROS,    Pulmonary exam normal        Cardiovascular negative cardio ROS Normal cardiovascular exam     Neuro/Psych  Headaches, PSYCHIATRIC DISORDERS Anxiety Depression    GI/Hepatic negative GI ROS, Neg liver ROS,   Endo/Other  negative endocrine ROS  Renal/GU negative Renal ROS     Musculoskeletal negative musculoskeletal ROS (+)   Abdominal   Peds  Hematology negative hematology ROS (+)   Anesthesia Other Findings   Reproductive/Obstetrics                            Anesthesia Physical Anesthesia Plan  ASA: II  Anesthesia Plan: General   Post-op Pain Management:    Induction: Intravenous  PONV Risk Score and Plan: 3 and Ondansetron, Dexamethasone and Midazolam  Airway Management Planned: LMA  Additional Equipment:   Intra-op Plan:   Post-operative Plan: Extubation in OR  Informed Consent: I have reviewed the patients History and Physical, chart, labs and discussed the procedure including the risks, benefits and alternatives for the proposed anesthesia with the patient or authorized representative who has indicated his/her understanding and acceptance.     Dental advisory given  Plan Discussed with: Anesthesiologist and CRNA  Anesthesia Plan Comments:        Anesthesia Quick Evaluation

## 2020-02-27 NOTE — Anesthesia Procedure Notes (Signed)
Procedure Name: LMA Insertion Date/Time: 02/27/2020 10:41 AM Performed by: Genelle Bal, CRNA Pre-anesthesia Checklist: Patient identified, Emergency Drugs available, Suction available and Patient being monitored Patient Re-evaluated:Patient Re-evaluated prior to induction Oxygen Delivery Method: Circle system utilized Preoxygenation: Pre-oxygenation with 100% oxygen Induction Type: IV induction Ventilation: Mask ventilation without difficulty LMA: LMA inserted LMA Size: 4.0 Number of attempts: 1 Airway Equipment and Method: Bite block Placement Confirmation: positive ETCO2 Tube secured with: Tape Dental Injury: Teeth and Oropharynx as per pre-operative assessment

## 2020-02-27 NOTE — Discharge Instructions (Signed)
   DISCHARGE INSTRUCTIONS: HYSTEROSCOPY   The following instructions have been prepared to help you care for yourself upon your return home.  May Remove Scop patch on or before  May take Ibuprofen after 3 pm today  May take stool softner while taking narcotic pain medication to prevent constipation.  Drink plenty of water.  Personal hygiene: Marland Kitchen Use sanitary pads for vaginal drainage, not tampons. . Shower the day after your procedure. . NO tub baths, pools or Jacuzzis for 2-3 weeks. . Wipe front to back after using the bathroom.  Activity and limitations: . Do NOT drive or operate any equipment for 24 hours. The effects of anesthesia are still present and drowsiness may result. . Do NOT rest in bed all day. . Walking is encouraged. . Walk up and down stairs slowly. . You may resume your normal activity in one to two days or as indicated by your physician. Sexual activity: NO intercourse for at least 2 weeks after the procedure, or as indicated by your Doctor.  Diet: Eat a light meal as desired this evening. You may resume your usual diet tomorrow.  Return to Work: You may resume your work activities in one to two days or as indicated by Marine scientist.  What to expect after your surgery: Expect to have vaginal bleeding/discharge for 2-3 days and spotting for up to 10 days. It is not unusual to have soreness for up to 1-2 weeks. You may have a slight burning sensation when you urinate for the first day. Mild cramps may continue for a couple of days. You may have a regular period in 2-6 weeks.  Call your doctor for any of the following: . Excessive vaginal bleeding or clotting, saturating and changing one pad every hour. . Inability to urinate 6 hours after discharge from hospital. . Pain not relieved by pain medication. . Fever of 100.4 F or greater. . Unusual vaginal discharge or odor.  Return to office _________________Call for an appointment ___________________ Patient's  signature: ______________________ Nurse's signature ________________________  [facility] [phone]

## 2020-02-27 NOTE — Transfer of Care (Signed)
Immediate Anesthesia Transfer of Care Note  Patient: Molly Brady  Procedure(s) Performed: DILATATION & CURETTAGE/HYSTEROSCOPY WITH MYOSURE (N/A Vagina )  Patient Location: PACU  Anesthesia Type:General  Level of Consciousness: awake, alert  and oriented  Airway & Oxygen Therapy: Patient Spontanous Breathing and Patient connected to face mask oxygen  Post-op Assessment: Report given to RN and Post -op Vital signs reviewed and stable  Post vital signs: Reviewed and stable  Last Vitals:  Vitals Value Taken Time  BP 150/86 02/27/20 1121  Temp    Pulse 94 02/27/20 1123  Resp 18 02/27/20 1123  SpO2 97 % 02/27/20 1123  Vitals shown include unvalidated device data.  Last Pain:  Vitals:   02/27/20 0914  TempSrc: Oral  PainSc: 0-No pain      Patients Stated Pain Goal: 5 (05/30/89 2284)  Complications: No complications documented.

## 2020-02-27 NOTE — Anesthesia Postprocedure Evaluation (Signed)
Anesthesia Post Note  Patient: Molly Brady  Procedure(s) Performed: DILATATION & CURETTAGE/HYSTEROSCOPY WITH MYOSURE (N/A Vagina )     Patient location during evaluation: PACU Anesthesia Type: General Level of consciousness: sedated Pain management: pain level controlled Vital Signs Assessment: post-procedure vital signs reviewed and stable Respiratory status: spontaneous breathing and respiratory function stable Cardiovascular status: stable Postop Assessment: no apparent nausea or vomiting Anesthetic complications: no   No complications documented.  Last Vitals:  Vitals:   02/27/20 1145 02/27/20 1158  BP: 126/80 (!) 136/77  Pulse: 90 80  Resp: (!) 11 15  Temp:  36.6 C  SpO2: 100% 100%    Last Pain:  Vitals:   02/27/20 1158  TempSrc:   PainSc: 0-No pain                 Brentlee Sciara DANIEL

## 2020-02-27 NOTE — Op Note (Addendum)
Preoperative diagnosis: PMB,  endometrial polyp  Postop diagnosis: as above.  Procedure: Hysteroscopic polypectomy, D&C Anesthesia General via LMA  Surgeon: Tiana Loft, MD  Assistant: IV fluids : 865ml Estimated blood loss : 72ml Urine output: straight catheter preop  94MH Complications none  Condition stable  Disposition PACU  Specimen: endometrial polyp with endometrial curettings   Procedure  Indication: PMB. Office sono noted 1cm fundal polyp. Patient was counseled on risks/ complications including infection, bleeding, damage to internal organs, she understood and agrees, gave informed written consent.  Patient was brought to the operating room with IV running. Time out was carried out.  She underwent general anesthesia via LMA without complications. She was given dorsolithotomy position. The patient was prepped and draped in standard fashion. Bladder was catheterized once. Bimanual exam revealed uterus to be anteverted and normal size. Speculum was placed and cervix was grasped with single-tooth tenaculum.   Cervical os was dilated to 19 [place] dilator. Hysteroscope was introduced in the uterine cavity under vision.  Findings: small polyp noted near left ostea, otherwise normal appearing cavity Hysteroscopic polypectomy was performed with myosure and curettage then performed with myosure. Specimen sent to path. Hysteroscope/myosure were removed.  Fluid deficit 45cc.  All counts are correct x2. No complications. Patient was made supine dorsal anesthesia and brought to the recovery room in stable condition.  Patient will be discharged home today. Discharge meds ibuprofen. Follow up in 2 weeks in office. Warning signs of infection and excessive bleeding reviewed.

## 2020-02-27 NOTE — H&P (Signed)
Molly Brady is an 59 y.o. female.G1P1 here for D&C, hysteroscopy, polypectomy.  H/o PMB with thickened endometrium noted.  SIS done and appears 1cm fundal polyp.  She has not had further bleeding.  Pertinent Gynecological History: See above  Menstrual History: postmenopausal Patient's last menstrual period was 08/03/2014.    Past Medical History:  Diagnosis Date  . ADHD   . Anemia    none recent  . Anxiety and depression   . Elevated liver enzymes 2019   resolved 2019 related to ibuprofen use  . Endometrial polyp    bleeding/spotting x  2-3 months  . Migraines     Past Surgical History:  Procedure Laterality Date  . UMBILICAL HERNIA REPAIR  as baby    Family History  Problem Relation Age of Onset  . Lung cancer Mother   . Asthma Mother   . Lung cancer Father   . Asthma Sister     Social History:  reports that she has never smoked. She has never used smokeless tobacco. She reports previous alcohol use. She reports previous drug use.  Allergies: No Known Allergies  Medications Prior to Admission  Medication Sig Dispense Refill Last Dose  . ALPRAZolam (XANAX) 0.5 MG tablet Take 1-2 tablets (0.5-1 mg total) by mouth at bedtime as needed. 60 tablet 1 02/26/2020 at Unknown time  . amphetamine-dextroamphetamine (ADDERALL) 30 MG tablet Take 1 tablet by mouth 3 (three) times daily. 90 tablet 0 02/27/2020 at 0530  . buPROPion (WELLBUTRIN XL) 150 MG 24 hr tablet Take 3 tablets (450 mg total) by mouth daily. TAKE 1 TABLET BY MOUTH DAILY IN THE MORNING 90 tablet 1 02/27/2020 at 0600  . gabapentin (NEURONTIN) 100 MG capsule Take 2 capsules (200 mg total) by mouth at bedtime. 60 capsule 2 02/26/2020 at Unknown time  . PARoxetine HCl (PAXIL PO) Take by mouth. 15 mg qhs   02/26/2020 at Unknown time  . rizatriptan (MAXALT) 10 MG tablet TAKE 1 TABLET BY MOUTH AT ONSET OF HEADACHE, MAY REPEAT IN 2 HOURS 12 tablet 5 Past Week at Unknown time  . UNABLE TO FIND Med Name: guiafensen 400 mg am    02/27/2020 at 0600    ROS  neg for SOB/chest pain/ HA/ vision changes/ LE pain or swelling/ abdomnal pain  Blood pressure (!) 116/86, pulse 95, temperature 98.5 F (36.9 C), temperature source Oral, resp. rate 18, height 5' 4.5" (1.638 m), weight 69.4 kg, last menstrual period 08/03/2014, SpO2 100 %. Physical Exam A&O x 3 HEENT : grossly wnl Lungs : ctab CV : rrr Abdo : soft, nt, nd Extr : no edema, nt bilat Pelvic : deferred  CBC Latest Ref Rng & Units 02/12/2020 04/29/2019 11/02/2017  WBC 3.8 - 10.8 Thousand/uL 5.0 5.9 4.9  Hemoglobin 11.7 - 15.5 g/dL 13.3 13.5 13.3  Hematocrit 35 - 45 % 40.6 42.4 39.5  Platelets 140 - 400 Thousand/uL 327 269 304    No results found for this or any previous visit (from the past 24 hour(s)).  No results found.  Assessment/Plan: 59 y/o with PMB, endometrial polyp 1. Proceed to OR for planned procedure.  Risk of bleeding, infection, uterine perforation, risk of further surgery, risk of anesthesia, risk of blood clot to leg/lung reviewed.  Consent signed.  Charyl Bigger 02/27/2020, 10:29 AM

## 2020-02-28 ENCOUNTER — Encounter (HOSPITAL_BASED_OUTPATIENT_CLINIC_OR_DEPARTMENT_OTHER): Payer: Self-pay | Admitting: Obstetrics and Gynecology

## 2020-02-28 LAB — SURGICAL PATHOLOGY

## 2020-03-08 ENCOUNTER — Other Ambulatory Visit: Payer: Self-pay | Admitting: Psychiatry

## 2020-03-08 DIAGNOSIS — Z8669 Personal history of other diseases of the nervous system and sense organs: Secondary | ICD-10-CM

## 2020-03-15 ENCOUNTER — Telehealth: Payer: Self-pay | Admitting: Psychiatry

## 2020-03-15 ENCOUNTER — Other Ambulatory Visit: Payer: Self-pay | Admitting: Psychiatry

## 2020-03-15 NOTE — Telephone Encounter (Signed)
Trina called to report that the Wellbutrin is working well.  But she has also gone back on Vyvanse 70mg  and reduce dose of Adderall.  She needs a refill of the Vyvanse.  Appt 8/25.  Send to [facility].

## 2020-03-19 ENCOUNTER — Other Ambulatory Visit: Payer: Self-pay | Admitting: Psychiatry

## 2020-03-19 NOTE — Telephone Encounter (Signed)
Discussed what patient currently taking Wellbutrin XL 450 mg daily, Vyvanse 70 mg daily, and Adderall 30 mg 1/2 tablet qid. She said this was her previous dose she was taking prior to July. She has enough for 7 days left on her Adderall, but they did only give her #60 instead of #90 like ordered.  Had her check her vital signs while on phone but patient had to run upstairs and find b/p cuff. Assumed it would be elevated 147/93, 97, her most recent at her GYN was 120/80, 94. That was when she was taking the current medication she says.

## 2020-03-19 NOTE — Telephone Encounter (Signed)
PDMP shows: 03/18/2020  1   10/19/2019  Alprazolam 0.5 MG Tablet  60.00  30 Ca Cot   [id number]   Ple ([id number])   2/2  2.00 LME  Comm Ins   [place]  02/29/2020  1   01/22/2020  Dextroamp-Amphetamin 30 MG Tab  60.00           At the last appointment in June she indicated she had stopped the Vyvanse because it was ineffective.  We started Wellbutrin and she was going to continue Adderall 30 mg twice daily.  She has been on Vyvanse 70 before with Adderall I am somewhat concerned about the combination of high-dose Vyvanse, high-dose Adderall plus Wellbutrin.  She has an appointment in about a week. She is asking to return to Vyvanse 70+ some unknown quantity of Adderall.  That needs clarification.  Understand she sent a letter which I will review, but at this point I would be opposed to her taking full dose 60 mg of Adderall along with the other medications at least until we review her vital signs at her appointment.

## 2020-03-19 NOTE — Telephone Encounter (Signed)
Pt called checking status on refill for Vyvanse 70 mg 1/d. Took last dose this morning and requested refill Adderall 30 mg 2/d (1/2 tab 4 x daily)= 60 mg daily. She has about 7-10 left. Perfect combo for her, she stated. Pt just diagnosed with cancer and will know  Wednesday if she will be able to keep 8/25 apt or have to reschedule for surgery @ [facility].

## 2020-03-20 NOTE — Telephone Encounter (Signed)
Pt called checking status on Vyvanse Rx.

## 2020-03-20 NOTE — Telephone Encounter (Signed)
Patient still requesting medication

## 2020-03-22 ENCOUNTER — Other Ambulatory Visit: Payer: Self-pay | Admitting: Psychiatry

## 2020-03-22 MED ORDER — LISDEXAMFETAMINE DIMESYLATE 60 MG PO CAPS: 60.0000 mg | ORAL_CAPSULE | ORAL | 0 refills | Status: DC

## 2020-03-22 NOTE — Telephone Encounter (Signed)
She is aware and will check her readings. She is determined to keep her telephone visit for Wednesday 08/25, informed her if she is not feeling up to it after surgery on Tuesday it's understandable and have her family call to cancel.

## 2020-03-22 NOTE — Telephone Encounter (Signed)
Please review

## 2020-03-22 NOTE — Telephone Encounter (Signed)
She has borderline hypertension and tachycardia which could be caused by the combo of Wellbutrin, Vyvanse, and Adderall all at the highest doses.  I'm going to reduce the Vyvanse from 70 to 60 mg daily.  She needs to monitor and record BP and pulse in AM before med, during day and a few evening recordings to bring to me.  She doesn't need to do this daily but record a few readings per week so I can see a pattern. Sent Rx for Vyvanse 60.

## 2020-03-22 NOTE — Telephone Encounter (Signed)
Molly Brady called again abut the change in medication.  She is having surgery for cancer on Tuesday.  So she changes her appt with you on Wednesday to a telephone visit so that if she doesn't get any direction from you before then, she can surely at least be in good enough shape after surgery to be able to talk with you.  Obviously if she can get some direction before surgery, she will reschedule her appt until a time that has time to heal from the surgery.

## 2020-03-27 ENCOUNTER — Telehealth (INDEPENDENT_AMBULATORY_CARE_PROVIDER_SITE_OTHER): Payer: 59 | Admitting: Psychiatry

## 2020-03-27 ENCOUNTER — Encounter: Payer: Self-pay | Admitting: Psychiatry

## 2020-03-27 DIAGNOSIS — F3342 Major depressive disorder, recurrent, in full remission: Secondary | ICD-10-CM

## 2020-03-27 DIAGNOSIS — F329 Major depressive disorder, single episode, unspecified: Secondary | ICD-10-CM

## 2020-03-27 DIAGNOSIS — F32A Depression, unspecified: Secondary | ICD-10-CM

## 2020-03-27 DIAGNOSIS — F5105 Insomnia due to other mental disorder: Secondary | ICD-10-CM

## 2020-03-27 DIAGNOSIS — F4001 Agoraphobia with panic disorder: Secondary | ICD-10-CM

## 2020-03-27 DIAGNOSIS — F900 Attention-deficit hyperactivity disorder, predominantly inattentive type: Secondary | ICD-10-CM

## 2020-03-27 DIAGNOSIS — F411 Generalized anxiety disorder: Secondary | ICD-10-CM

## 2020-03-27 DIAGNOSIS — F419 Anxiety disorder, unspecified: Secondary | ICD-10-CM

## 2020-03-27 DIAGNOSIS — G4710 Hypersomnia, unspecified: Secondary | ICD-10-CM

## 2020-03-27 MED ORDER — GABAPENTIN 100 MG PO CAPS: 200.0000 mg | ORAL_CAPSULE | Freq: Every day | ORAL | 2 refills | Status: DC

## 2020-03-27 MED ORDER — BUPROPION HCL ER (XL) 150 MG PO TB24: 450.0000 mg | ORAL_TABLET | Freq: Every day | ORAL | 0 refills | Status: DC

## 2020-03-27 MED ORDER — LISDEXAMFETAMINE DIMESYLATE 60 MG PO CAPS: 60.0000 mg | ORAL_CAPSULE | ORAL | 0 refills | Status: DC

## 2020-03-27 MED ORDER — AMPHETAMINE-DEXTROAMPHETAMINE 30 MG PO TABS: 15.0000 mg | ORAL_TABLET | Freq: Four times a day (QID) | ORAL | 0 refills | Status: DC

## 2020-03-27 MED ORDER — ALPRAZOLAM 0.5 MG PO TABS: 0.5000 mg | ORAL_TABLET | Freq: Every evening | ORAL | 1 refills | Status: DC | PRN

## 2020-03-27 MED ORDER — PAROXETINE HCL 30 MG PO TABS: 30.0000 mg | ORAL_TABLET | Freq: Every day | ORAL | 1 refills | Status: DC
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
## 2020-05-08 ENCOUNTER — Other Ambulatory Visit: Payer: Self-pay | Admitting: Internal Medicine

## 2020-05-08 DIAGNOSIS — Z1231 Encounter for screening mammogram for malignant neoplasm of breast: Secondary | ICD-10-CM

## 2020-05-09 ENCOUNTER — Ambulatory Visit
Admission: RE | Admit: 2020-05-09 | Discharge: 2020-05-09 | Disposition: A | Discharge status: Home / Self Care | Payer: 59 | Source: Ambulatory Visit

## 2020-05-09 ENCOUNTER — Other Ambulatory Visit: Payer: Self-pay

## 2020-05-09 DIAGNOSIS — Z1231 Encounter for screening mammogram for malignant neoplasm of breast: Secondary | ICD-10-CM

## 2020-05-21 ENCOUNTER — Other Ambulatory Visit: Payer: Self-pay | Admitting: Psychiatry

## 2020-05-21 DIAGNOSIS — F900 Attention-deficit hyperactivity disorder, predominantly inattentive type: Secondary | ICD-10-CM

## 2020-05-23 NOTE — Telephone Encounter (Signed)
Pt called requesting refill for Vyvance 60 mg 1/d to [facility]. Pt out of meds. Called in Tuesday. Apt 11/1

## 2020-05-23 NOTE — Telephone Encounter (Signed)
Apt 06/03/20

## 2020-06-03 ENCOUNTER — Encounter: Payer: Self-pay | Admitting: Psychiatry

## 2020-06-03 ENCOUNTER — Telehealth (INDEPENDENT_AMBULATORY_CARE_PROVIDER_SITE_OTHER): Payer: 59 | Admitting: Psychiatry

## 2020-06-03 DIAGNOSIS — F4001 Agoraphobia with panic disorder: Secondary | ICD-10-CM | POA: Diagnosis not present

## 2020-06-03 DIAGNOSIS — F419 Anxiety disorder, unspecified: Secondary | ICD-10-CM

## 2020-06-03 DIAGNOSIS — F900 Attention-deficit hyperactivity disorder, predominantly inattentive type: Secondary | ICD-10-CM

## 2020-06-03 DIAGNOSIS — F411 Generalized anxiety disorder: Secondary | ICD-10-CM | POA: Diagnosis not present

## 2020-06-03 DIAGNOSIS — Z8669 Personal history of other diseases of the nervous system and sense organs: Secondary | ICD-10-CM

## 2020-06-03 DIAGNOSIS — F3342 Major depressive disorder, recurrent, in full remission: Secondary | ICD-10-CM | POA: Diagnosis not present

## 2020-06-03 DIAGNOSIS — F32A Depression, unspecified: Secondary | ICD-10-CM

## 2020-06-03 DIAGNOSIS — F5105 Insomnia due to other mental disorder: Secondary | ICD-10-CM

## 2020-06-03 MED ORDER — LISDEXAMFETAMINE DIMESYLATE 60 MG PO CAPS: 60.0000 mg | ORAL_CAPSULE | ORAL | 0 refills | Status: DC

## 2020-06-03 MED ORDER — BUPROPION HCL ER (XL) 150 MG PO TB24: 450.0000 mg | ORAL_TABLET | Freq: Every day | ORAL | 0 refills | Status: DC

## 2020-06-03 MED ORDER — PAROXETINE HCL 30 MG PO TABS: 45.0000 mg | ORAL_TABLET | Freq: Every day | ORAL | 1 refills | Status: DC

## 2020-06-03 MED ORDER — AMPHETAMINE-DEXTROAMPHETAMINE 30 MG PO TABS: 15.0000 mg | ORAL_TABLET | Freq: Four times a day (QID) | ORAL | 0 refills | Status: DC

## 2020-06-03 MED ORDER — GABAPENTIN 100 MG PO CAPS
200.0000 mg | ORAL_CAPSULE | Freq: Every day | ORAL | 1 refills | Status: DC
Start: 2020-06-03 — End: 2020-12-04
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
## 2020-06-14 ENCOUNTER — Other Ambulatory Visit: Payer: Self-pay | Admitting: Psychiatry

## 2020-06-14 DIAGNOSIS — F5105 Insomnia due to other mental disorder: Secondary | ICD-10-CM

## 2020-06-14 DIAGNOSIS — F419 Anxiety disorder, unspecified: Secondary | ICD-10-CM

## 2020-06-14 DIAGNOSIS — F32A Depression, unspecified: Secondary | ICD-10-CM

## 2020-07-03 ENCOUNTER — Telehealth: Payer: Self-pay | Admitting: Psychiatry

## 2020-07-03 NOTE — Telephone Encounter (Signed)
Pt stated Pharmacy will not fill her Adderall unttil 12/27 but, she will be out on 12/4. Pt stated she think it was written in error. She uses the [facility].

## 2020-07-04 ENCOUNTER — Other Ambulatory Visit: Payer: Self-pay

## 2020-07-04 DIAGNOSIS — F900 Attention-deficit hyperactivity disorder, predominantly inattentive type: Secondary | ICD-10-CM

## 2020-07-04 MED ORDER — AMPHETAMINE-DEXTROAMPHETAMINE 30 MG PO TABS: 15.0000 mg | ORAL_TABLET | Freq: Four times a day (QID) | ORAL | 0 refills | Status: DC

## 2020-07-04 NOTE — Telephone Encounter (Signed)
Clarification patient is referring to the actual Rx states do not fill till 29 th. Will pend one to fill today.

## 2020-07-04 NOTE — Telephone Encounter (Signed)
Last refill 06/06/20, will contact [facility] to verify

## 2020-09-30 ENCOUNTER — Other Ambulatory Visit: Payer: Self-pay | Admitting: Psychiatry

## 2020-09-30 DIAGNOSIS — Z8669 Personal history of other diseases of the nervous system and sense organs: Secondary | ICD-10-CM

## 2020-10-08 ENCOUNTER — Other Ambulatory Visit: Payer: Self-pay | Admitting: Psychiatry

## 2020-10-08 DIAGNOSIS — F3342 Major depressive disorder, recurrent, in full remission: Secondary | ICD-10-CM

## 2020-10-08 DIAGNOSIS — F900 Attention-deficit hyperactivity disorder, predominantly inattentive type: Secondary | ICD-10-CM

## 2020-10-22 ENCOUNTER — Encounter: Payer: Self-pay | Admitting: Psychiatry

## 2020-10-22 ENCOUNTER — Telehealth (INDEPENDENT_AMBULATORY_CARE_PROVIDER_SITE_OTHER): Payer: 59 | Admitting: Psychiatry

## 2020-10-22 DIAGNOSIS — F5105 Insomnia due to other mental disorder: Secondary | ICD-10-CM | POA: Diagnosis not present

## 2020-10-22 DIAGNOSIS — F419 Anxiety disorder, unspecified: Secondary | ICD-10-CM

## 2020-10-22 DIAGNOSIS — F900 Attention-deficit hyperactivity disorder, predominantly inattentive type: Secondary | ICD-10-CM

## 2020-10-22 DIAGNOSIS — F3342 Major depressive disorder, recurrent, in full remission: Secondary | ICD-10-CM

## 2020-10-22 DIAGNOSIS — F32A Depression, unspecified: Secondary | ICD-10-CM

## 2020-10-22 DIAGNOSIS — F4001 Agoraphobia with panic disorder: Secondary | ICD-10-CM

## 2020-10-22 DIAGNOSIS — F411 Generalized anxiety disorder: Secondary | ICD-10-CM

## 2020-10-22 MED ORDER — AMPHETAMINE-DEXTROAMPHETAMINE 30 MG PO TABS: 15.0000 mg | ORAL_TABLET | Freq: Four times a day (QID) | ORAL | 0 refills | Status: DC

## 2020-10-22 MED ORDER — LISDEXAMFETAMINE DIMESYLATE 60 MG PO CAPS: 60.0000 mg | ORAL_CAPSULE | ORAL | 0 refills | Status: DC

## 2020-10-22 MED ORDER — ALPRAZOLAM 0.5 MG PO TABS: ORAL_TABLET | ORAL | 1 refills | Status: DC

## 2020-10-22 MED ORDER — PAROXETINE HCL 40 MG PO TABS
40.0000 mg | ORAL_TABLET | Freq: Every day | ORAL | 0 refills | Status: DC
Start: 2020-10-22 — End: 2021-01-29
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
## 2020-11-18 ENCOUNTER — Other Ambulatory Visit: Payer: Self-pay | Admitting: Psychiatry

## 2020-11-18 DIAGNOSIS — F900 Attention-deficit hyperactivity disorder, predominantly inattentive type: Secondary | ICD-10-CM

## 2020-11-18 NOTE — Telephone Encounter (Signed)
controlled substance

## 2020-12-02 ENCOUNTER — Other Ambulatory Visit: Payer: Self-pay | Admitting: Psychiatry

## 2020-12-02 DIAGNOSIS — F411 Generalized anxiety disorder: Secondary | ICD-10-CM

## 2020-12-02 DIAGNOSIS — F419 Anxiety disorder, unspecified: Secondary | ICD-10-CM

## 2020-12-02 DIAGNOSIS — F32A Depression, unspecified: Secondary | ICD-10-CM

## 2020-12-02 DIAGNOSIS — F5105 Insomnia due to other mental disorder: Secondary | ICD-10-CM

## 2020-12-04 NOTE — Telephone Encounter (Signed)
Patient called and is in need of her two Refills asap. She is out of meds. Looks like this got routed to [facility] and didn't get transferred to Clinical Staff. Please Refill.

## 2020-12-04 NOTE — Telephone Encounter (Signed)
Please approve

## 2020-12-04 NOTE — Telephone Encounter (Signed)
Next apt 6/29 Last refill 3/31

## 2020-12-06 ENCOUNTER — Other Ambulatory Visit: Payer: Self-pay | Admitting: Psychiatry

## 2020-12-06 DIAGNOSIS — F900 Attention-deficit hyperactivity disorder, predominantly inattentive type: Secondary | ICD-10-CM

## 2020-12-10 NOTE — Telephone Encounter (Signed)
Please refuse, she should already have 1 on file for May

## 2020-12-16 ENCOUNTER — Other Ambulatory Visit: Payer: Self-pay | Admitting: Psychiatry

## 2021-01-06 ENCOUNTER — Other Ambulatory Visit: Payer: Self-pay | Admitting: Psychiatry

## 2021-01-06 DIAGNOSIS — F900 Attention-deficit hyperactivity disorder, predominantly inattentive type: Secondary | ICD-10-CM

## 2021-01-06 DIAGNOSIS — F3342 Major depressive disorder, recurrent, in full remission: Secondary | ICD-10-CM

## 2021-01-13 ENCOUNTER — Other Ambulatory Visit: Payer: Self-pay | Admitting: Psychiatry

## 2021-01-13 ENCOUNTER — Other Ambulatory Visit: Payer: Self-pay

## 2021-01-14 ENCOUNTER — Other Ambulatory Visit: Payer: Self-pay | Admitting: Psychiatry

## 2021-01-29 ENCOUNTER — Encounter: Payer: Self-pay | Admitting: Psychiatry

## 2021-01-29 ENCOUNTER — Telehealth (INDEPENDENT_AMBULATORY_CARE_PROVIDER_SITE_OTHER): Payer: 59 | Admitting: Psychiatry

## 2021-01-29 DIAGNOSIS — F4001 Agoraphobia with panic disorder: Secondary | ICD-10-CM | POA: Diagnosis not present

## 2021-01-29 DIAGNOSIS — F3342 Major depressive disorder, recurrent, in full remission: Secondary | ICD-10-CM | POA: Diagnosis not present

## 2021-01-29 DIAGNOSIS — F411 Generalized anxiety disorder: Secondary | ICD-10-CM | POA: Diagnosis not present

## 2021-01-29 DIAGNOSIS — F900 Attention-deficit hyperactivity disorder, predominantly inattentive type: Secondary | ICD-10-CM | POA: Diagnosis not present

## 2021-01-29 MED ORDER — AMPHETAMINE-DEXTROAMPHETAMINE 30 MG PO TABS: ORAL_TABLET | ORAL | 0 refills | Status: DC

## 2021-01-29 MED ORDER — BUPROPION HCL ER (XL) 150 MG PO TB24: 450.0000 mg | ORAL_TABLET | Freq: Every day | ORAL | 0 refills | Status: DC

## 2021-01-29 MED ORDER — PAROXETINE HCL 40 MG PO TABS: 40.0000 mg | ORAL_TABLET | Freq: Every day | ORAL | 0 refills | Status: DC

## 2021-01-29 MED ORDER — LISDEXAMFETAMINE DIMESYLATE 60 MG PO CAPS: 60.0000 mg | ORAL_CAPSULE | ORAL | 0 refills | Status: DC
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
## 2021-02-06 ENCOUNTER — Telehealth: Payer: Self-pay | Admitting: Psychiatry

## 2021-02-06 NOTE — Telephone Encounter (Signed)
Pt called stating [facility] only has generic Adderall Epic brand. Pt can not take that brand. Pt called around [facility]  [address], [place] Has generic Adderall [place] brand . Requesting to send Adderall Rx from now on to [facility] [place] until advised be Pt otherwise. Just Adderall Rx, Pt stated. July, August @ Sept @ [place]. Transfer all 3.

## 2021-02-07 ENCOUNTER — Telehealth: Payer: Self-pay

## 2021-02-07 NOTE — Telephone Encounter (Signed)
Last refill 6/14

## 2021-02-10 ENCOUNTER — Telehealth: Payer: Self-pay

## 2021-02-10 ENCOUNTER — Other Ambulatory Visit: Payer: Self-pay

## 2021-02-10 ENCOUNTER — Other Ambulatory Visit: Payer: Self-pay | Admitting: Psychiatry

## 2021-02-10 DIAGNOSIS — F900 Attention-deficit hyperactivity disorder, predominantly inattentive type: Secondary | ICD-10-CM

## 2021-02-10 NOTE — Telephone Encounter (Signed)
Pended.

## 2021-02-10 NOTE — Telephone Encounter (Signed)
Molly Brady called to see if her Adderall prescriptions had been cancelled at [facility] and sent to [facility] [place], [place] as requested 02/06/21  The [facility] has [facility] the [facility] because [facility] have the generic she needs.  Please send the Adderall prescriptions now and going [facility] the [facility].

## 2021-02-12 ENCOUNTER — Other Ambulatory Visit: Payer: Self-pay | Admitting: Psychiatry

## 2021-02-12 DIAGNOSIS — F900 Attention-deficit hyperactivity disorder, predominantly inattentive type: Secondary | ICD-10-CM

## 2021-02-12 MED ORDER — AMPHETAMINE-DEXTROAMPHETAMINE 30 MG PO TABS: ORAL_TABLET | ORAL | 0 refills | Status: DC

## 2021-02-12 NOTE — Telephone Encounter (Signed)
Pt will be out of the adderall tomorrow. Please cancel all scripts and [facility]

## 2021-02-12 NOTE — Telephone Encounter (Signed)
Please approve

## 2021-03-13 ENCOUNTER — Telehealth: Payer: Self-pay | Admitting: Psychiatry

## 2021-03-13 ENCOUNTER — Other Ambulatory Visit: Payer: Self-pay

## 2021-03-13 DIAGNOSIS — F900 Attention-deficit hyperactivity disorder, predominantly inattentive type: Secondary | ICD-10-CM

## 2021-03-13 NOTE — Telephone Encounter (Signed)
Pended.

## 2021-03-13 NOTE — Telephone Encounter (Signed)
Next visit is 05/29/21. Requesting refill on Dextroamphetamines called to [facility] [place] [address], [place], [place]. Phone number is [phone]

## 2021-03-14 ENCOUNTER — Other Ambulatory Visit: Payer: Self-pay

## 2021-03-14 DIAGNOSIS — F900 Attention-deficit hyperactivity disorder, predominantly inattentive type: Secondary | ICD-10-CM

## 2021-03-14 MED ORDER — AMPHETAMINE-DEXTROAMPHETAMINE 30 MG PO TABS: ORAL_TABLET | ORAL | 0 refills | Status: DC

## 2021-03-14 NOTE — Telephone Encounter (Signed)
Pt called requesting Adderall 30 mg Rx to [facility]. Going forward. [facility] can't get brand needed. Canc. Rx @ [facility] and send to [facility]. Apt 10/27

## 2021-03-27 ENCOUNTER — Other Ambulatory Visit: Payer: Self-pay | Admitting: Obstetrics

## 2021-03-27 ENCOUNTER — Other Ambulatory Visit: Payer: Self-pay | Admitting: Psychiatry

## 2021-03-27 DIAGNOSIS — Z8669 Personal history of other diseases of the nervous system and sense organs: Secondary | ICD-10-CM

## 2021-03-27 DIAGNOSIS — Z1231 Encounter for screening mammogram for malignant neoplasm of breast: Secondary | ICD-10-CM

## 2021-05-06 ENCOUNTER — Telehealth: Payer: Self-pay | Admitting: Psychiatry

## 2021-05-06 DIAGNOSIS — F900 Attention-deficit hyperactivity disorder, predominantly inattentive type: Secondary | ICD-10-CM

## 2021-05-08 NOTE — Telephone Encounter (Signed)
Patient Molly Brady called concerning the refill for the Adderall. She indicated that all future refills be submitted to the [facility]. Patient is scheduled for a follow up appointment on 10/27.

## 2021-05-12 ENCOUNTER — Other Ambulatory Visit: Payer: Self-pay

## 2021-05-12 ENCOUNTER — Ambulatory Visit
Admission: RE | Admit: 2021-05-12 | Discharge: 2021-05-12 | Disposition: A | Discharge status: Home / Self Care | Payer: 59 | Source: Ambulatory Visit

## 2021-05-12 DIAGNOSIS — Z1231 Encounter for screening mammogram for malignant neoplasm of breast: Secondary | ICD-10-CM

## 2021-05-12 DIAGNOSIS — F900 Attention-deficit hyperactivity disorder, predominantly inattentive type: Secondary | ICD-10-CM

## 2021-05-12 MED ORDER — AMPHETAMINE-DEXTROAMPHETAMINE 30 MG PO TABS: ORAL_TABLET | ORAL | 0 refills | Status: DC

## 2021-05-12 NOTE — Telephone Encounter (Signed)
pended

## 2021-05-12 NOTE — Telephone Encounter (Signed)
Addendum to previous message. Molly Brady just called to get an update on her Adderall being called in. She is taking her last pill today and will be out tomorrow.

## 2021-05-21 ENCOUNTER — Other Ambulatory Visit: Payer: Self-pay | Admitting: Psychiatry

## 2021-05-21 DIAGNOSIS — F419 Anxiety disorder, unspecified: Secondary | ICD-10-CM

## 2021-05-21 DIAGNOSIS — F5105 Insomnia due to other mental disorder: Secondary | ICD-10-CM

## 2021-05-21 DIAGNOSIS — F32A Depression, unspecified: Secondary | ICD-10-CM

## 2021-05-21 DIAGNOSIS — F411 Generalized anxiety disorder: Secondary | ICD-10-CM

## 2021-05-21 NOTE — Telephone Encounter (Signed)
Xanax last filled 04/24/21 appt on 10/27

## 2021-05-23 ENCOUNTER — Encounter: Payer: Self-pay | Admitting: Internal Medicine

## 2021-05-29 ENCOUNTER — Ambulatory Visit (INDEPENDENT_AMBULATORY_CARE_PROVIDER_SITE_OTHER): Payer: 59 | Admitting: Psychiatry

## 2021-05-29 ENCOUNTER — Encounter: Payer: Self-pay | Admitting: Psychiatry

## 2021-05-29 DIAGNOSIS — F419 Anxiety disorder, unspecified: Secondary | ICD-10-CM

## 2021-05-29 DIAGNOSIS — F4001 Agoraphobia with panic disorder: Secondary | ICD-10-CM

## 2021-05-29 DIAGNOSIS — Z8669 Personal history of other diseases of the nervous system and sense organs: Secondary | ICD-10-CM

## 2021-05-29 DIAGNOSIS — F5105 Insomnia due to other mental disorder: Secondary | ICD-10-CM

## 2021-05-29 DIAGNOSIS — F3342 Major depressive disorder, recurrent, in full remission: Secondary | ICD-10-CM

## 2021-05-29 DIAGNOSIS — F900 Attention-deficit hyperactivity disorder, predominantly inattentive type: Secondary | ICD-10-CM | POA: Diagnosis not present

## 2021-05-29 DIAGNOSIS — F32A Depression, unspecified: Secondary | ICD-10-CM

## 2021-05-29 DIAGNOSIS — F411 Generalized anxiety disorder: Secondary | ICD-10-CM | POA: Diagnosis not present

## 2021-05-29 MED ORDER — ALPRAZOLAM 0.5 MG PO TABS: 0.5000 mg | ORAL_TABLET | Freq: Every evening | ORAL | 5 refills | Status: DC | PRN

## 2021-05-29 MED ORDER — LISDEXAMFETAMINE DIMESYLATE 60 MG PO CAPS: ORAL_CAPSULE | ORAL | 0 refills | Status: DC

## 2021-05-29 MED ORDER — PAROXETINE HCL 40 MG PO TABS: 40.0000 mg | ORAL_TABLET | Freq: Every day | ORAL | 1 refills | Status: DC

## 2021-05-29 MED ORDER — AMPHETAMINE-DEXTROAMPHETAMINE 30 MG PO TABS: ORAL_TABLET | ORAL | 0 refills | Status: DC

## 2021-05-29 MED ORDER — LISDEXAMFETAMINE DIMESYLATE 60 MG PO CAPS: 60.0000 mg | ORAL_CAPSULE | ORAL | 0 refills | Status: DC

## 2021-05-29 MED ORDER — BUPROPION HCL ER (XL) 150 MG PO TB24: 450.0000 mg | ORAL_TABLET | Freq: Every day | ORAL | 1 refills | Status: DC
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
## 2021-06-11 ENCOUNTER — Other Ambulatory Visit: Payer: Self-pay | Admitting: Psychiatry

## 2021-06-11 DIAGNOSIS — F900 Attention-deficit hyperactivity disorder, predominantly inattentive type: Secondary | ICD-10-CM

## 2021-06-11 NOTE — Telephone Encounter (Signed)
Please refuse it was sent already

## 2021-06-19 ENCOUNTER — Other Ambulatory Visit: Payer: Self-pay | Admitting: Psychiatry

## 2021-06-19 DIAGNOSIS — F419 Anxiety disorder, unspecified: Secondary | ICD-10-CM

## 2021-06-19 DIAGNOSIS — F5105 Insomnia due to other mental disorder: Secondary | ICD-10-CM

## 2021-08-04 ENCOUNTER — Telehealth: Payer: Self-pay

## 2021-08-04 NOTE — Telephone Encounter (Signed)
Prior Authorization submitted and approved for VYVANSE 60 MG effective 08/04/2021-08/03/2022 with [employer] ID# [id number]

## 2021-08-19 ENCOUNTER — Other Ambulatory Visit: Payer: Self-pay | Admitting: Psychiatry

## 2021-08-19 DIAGNOSIS — F419 Anxiety disorder, unspecified: Secondary | ICD-10-CM

## 2021-08-19 DIAGNOSIS — F5105 Insomnia due to other mental disorder: Secondary | ICD-10-CM

## 2021-08-19 DIAGNOSIS — F411 Generalized anxiety disorder: Secondary | ICD-10-CM

## 2021-09-02 ENCOUNTER — Other Ambulatory Visit: Payer: Self-pay | Admitting: Psychiatry

## 2021-09-02 DIAGNOSIS — F900 Attention-deficit hyperactivity disorder, predominantly inattentive type: Secondary | ICD-10-CM

## 2021-09-03 MED ORDER — LISDEXAMFETAMINE DIMESYLATE 60 MG PO CAPS: 60.0000 mg | ORAL_CAPSULE | ORAL | 0 refills | Status: DC

## 2021-09-11 ENCOUNTER — Other Ambulatory Visit: Payer: Self-pay | Admitting: Psychiatry

## 2021-09-11 DIAGNOSIS — N3946 Mixed incontinence: Secondary | ICD-10-CM | POA: Diagnosis not present

## 2021-09-11 DIAGNOSIS — F900 Attention-deficit hyperactivity disorder, predominantly inattentive type: Secondary | ICD-10-CM

## 2021-09-11 DIAGNOSIS — R159 Full incontinence of feces: Secondary | ICD-10-CM | POA: Diagnosis not present

## 2021-09-11 DIAGNOSIS — C541 Malignant neoplasm of endometrium: Secondary | ICD-10-CM | POA: Diagnosis not present

## 2021-09-24 ENCOUNTER — Other Ambulatory Visit: Payer: Self-pay | Admitting: Psychiatry

## 2021-09-24 DIAGNOSIS — Z8669 Personal history of other diseases of the nervous system and sense organs: Secondary | ICD-10-CM

## 2021-10-09 ENCOUNTER — Other Ambulatory Visit: Payer: Self-pay | Admitting: Psychiatry

## 2021-11-05 ENCOUNTER — Other Ambulatory Visit: Payer: Self-pay | Admitting: Psychiatry

## 2021-11-10 ENCOUNTER — Other Ambulatory Visit: Payer: Self-pay | Admitting: Psychiatry

## 2021-11-10 DIAGNOSIS — F32A Depression, unspecified: Secondary | ICD-10-CM

## 2021-11-10 DIAGNOSIS — F411 Generalized anxiety disorder: Secondary | ICD-10-CM

## 2021-11-10 DIAGNOSIS — F900 Attention-deficit hyperactivity disorder, predominantly inattentive type: Secondary | ICD-10-CM

## 2021-11-10 DIAGNOSIS — F5105 Insomnia due to other mental disorder: Secondary | ICD-10-CM

## 2021-12-03 ENCOUNTER — Other Ambulatory Visit: Payer: Self-pay | Admitting: Psychiatry

## 2021-12-09 ENCOUNTER — Telehealth: Payer: Self-pay | Admitting: Psychiatry

## 2021-12-09 DIAGNOSIS — F900 Attention-deficit hyperactivity disorder, predominantly inattentive type: Secondary | ICD-10-CM

## 2021-12-22 ENCOUNTER — Other Ambulatory Visit: Payer: Self-pay | Admitting: Psychiatry

## 2021-12-22 DIAGNOSIS — F32A Depression, unspecified: Secondary | ICD-10-CM

## 2021-12-22 DIAGNOSIS — F900 Attention-deficit hyperactivity disorder, predominantly inattentive type: Secondary | ICD-10-CM

## 2021-12-22 DIAGNOSIS — F4001 Agoraphobia with panic disorder: Secondary | ICD-10-CM

## 2021-12-22 DIAGNOSIS — F3342 Major depressive disorder, recurrent, in full remission: Secondary | ICD-10-CM

## 2021-12-22 DIAGNOSIS — F5105 Insomnia due to other mental disorder: Secondary | ICD-10-CM

## 2021-12-22 DIAGNOSIS — F411 Generalized anxiety disorder: Secondary | ICD-10-CM

## 2022-01-02 ENCOUNTER — Other Ambulatory Visit: Payer: Self-pay

## 2022-01-02 DIAGNOSIS — F900 Attention-deficit hyperactivity disorder, predominantly inattentive type: Secondary | ICD-10-CM

## 2022-01-02 MED ORDER — AMPHETAMINE-DEXTROAMPHETAMINE 30 MG PO TABS: ORAL_TABLET | ORAL | 0 refills | Status: DC

## 2022-01-02 NOTE — Telephone Encounter (Signed)
Pt called today asking about status of Adderall '30mg'$ .  She says it's available at   [place], [place] [address], [place] [address]  Phone:  [phone]  Fax:  [phone]  She is out of pills tomorrow.  Next appt. 6/22

## 2022-01-02 NOTE — Telephone Encounter (Signed)
Pended.

## 2022-01-22 ENCOUNTER — Ambulatory Visit (INDEPENDENT_AMBULATORY_CARE_PROVIDER_SITE_OTHER): Payer: Self-pay | Admitting: Psychiatry

## 2022-01-22 ENCOUNTER — Encounter: Payer: Self-pay | Admitting: Psychiatry

## 2022-01-22 DIAGNOSIS — F900 Attention-deficit hyperactivity disorder, predominantly inattentive type: Secondary | ICD-10-CM

## 2022-01-22 DIAGNOSIS — Z8669 Personal history of other diseases of the nervous system and sense organs: Secondary | ICD-10-CM

## 2022-01-22 MED ORDER — AMPHETAMINE-DEXTROAMPHETAMINE 30 MG PO TABS: 15.0000 mg | ORAL_TABLET | Freq: Four times a day (QID) | ORAL | 0 refills | Status: DC

## 2022-01-22 MED ORDER — AMPHETAMINE-DEXTROAMPHETAMINE 30 MG PO TABS: ORAL_TABLET | ORAL | 0 refills | Status: DC

## 2022-01-22 MED ORDER — RIZATRIPTAN BENZOATE 10 MG PO TABS: ORAL_TABLET | ORAL | 3 refills | Status: DC

## 2022-01-22 MED ORDER — LISDEXAMFETAMINE DIMESYLATE 60 MG PO CAPS: 60.0000 mg | ORAL_CAPSULE | ORAL | 0 refills | Status: DC

## 2022-01-22 MED ORDER — MEMANTINE HCL 10 MG PO TABS: ORAL_TABLET | ORAL | 1 refills | Status: DC

## 2022-01-22 MED ORDER — LISDEXAMFETAMINE DIMESYLATE 60 MG PO CAPS: ORAL_CAPSULE | ORAL | 0 refills | Status: DC
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
## 2022-02-09 ENCOUNTER — Other Ambulatory Visit: Payer: Self-pay | Admitting: Psychiatry

## 2022-02-09 DIAGNOSIS — F32A Depression, unspecified: Secondary | ICD-10-CM

## 2022-02-09 DIAGNOSIS — F5105 Insomnia due to other mental disorder: Secondary | ICD-10-CM

## 2022-02-09 DIAGNOSIS — F411 Generalized anxiety disorder: Secondary | ICD-10-CM

## 2022-02-18 ENCOUNTER — Other Ambulatory Visit: Payer: Self-pay | Admitting: Psychiatry

## 2022-02-18 DIAGNOSIS — F5105 Insomnia due to other mental disorder: Secondary | ICD-10-CM

## 2022-02-18 DIAGNOSIS — F419 Anxiety disorder, unspecified: Secondary | ICD-10-CM

## 2022-02-18 NOTE — Telephone Encounter (Signed)
Filled 6/20 appt 9/13

## 2022-02-26 DIAGNOSIS — Z86010 Personal history of colonic polyps: Secondary | ICD-10-CM | POA: Diagnosis not present

## 2022-02-26 DIAGNOSIS — K600 Acute anal fissure: Secondary | ICD-10-CM | POA: Diagnosis not present

## 2022-02-26 DIAGNOSIS — F419 Anxiety disorder, unspecified: Secondary | ICD-10-CM | POA: Diagnosis not present

## 2022-02-26 DIAGNOSIS — K5900 Constipation, unspecified: Secondary | ICD-10-CM | POA: Diagnosis not present

## 2022-02-26 DIAGNOSIS — F32A Depression, unspecified: Secondary | ICD-10-CM | POA: Diagnosis not present

## 2022-02-26 DIAGNOSIS — K625 Hemorrhage of anus and rectum: Secondary | ICD-10-CM | POA: Diagnosis not present

## 2022-03-12 DIAGNOSIS — Z8542 Personal history of malignant neoplasm of other parts of uterus: Secondary | ICD-10-CM | POA: Diagnosis not present

## 2022-03-12 DIAGNOSIS — N9089 Other specified noninflammatory disorders of vulva and perineum: Secondary | ICD-10-CM | POA: Diagnosis not present

## 2022-03-20 ENCOUNTER — Other Ambulatory Visit: Payer: Self-pay | Admitting: Psychiatry

## 2022-03-20 DIAGNOSIS — F900 Attention-deficit hyperactivity disorder, predominantly inattentive type: Secondary | ICD-10-CM

## 2022-03-20 DIAGNOSIS — F3342 Major depressive disorder, recurrent, in full remission: Secondary | ICD-10-CM

## 2022-04-15 ENCOUNTER — Telehealth: Payer: Self-pay | Admitting: Psychiatry

## 2022-04-17 ENCOUNTER — Ambulatory Visit (INDEPENDENT_AMBULATORY_CARE_PROVIDER_SITE_OTHER): Payer: Self-pay | Admitting: Psychiatry

## 2022-04-17 ENCOUNTER — Encounter: Payer: Self-pay | Admitting: Psychiatry

## 2022-04-17 DIAGNOSIS — F5105 Insomnia due to other mental disorder: Secondary | ICD-10-CM

## 2022-04-17 DIAGNOSIS — F3342 Major depressive disorder, recurrent, in full remission: Secondary | ICD-10-CM

## 2022-04-17 DIAGNOSIS — F4001 Agoraphobia with panic disorder: Secondary | ICD-10-CM

## 2022-04-17 DIAGNOSIS — F411 Generalized anxiety disorder: Secondary | ICD-10-CM

## 2022-04-17 DIAGNOSIS — Z8669 Personal history of other diseases of the nervous system and sense organs: Secondary | ICD-10-CM

## 2022-04-17 DIAGNOSIS — F900 Attention-deficit hyperactivity disorder, predominantly inattentive type: Secondary | ICD-10-CM

## 2022-04-17 MED ORDER — LISDEXAMFETAMINE DIMESYLATE 60 MG PO CAPS: ORAL_CAPSULE | ORAL | 0 refills | Status: DC

## 2022-04-17 MED ORDER — LISDEXAMFETAMINE DIMESYLATE 60 MG PO CAPS: 60.0000 mg | ORAL_CAPSULE | ORAL | 0 refills | Status: DC

## 2022-04-17 MED ORDER — PAROXETINE HCL 20 MG PO TABS: 20.0000 mg | ORAL_TABLET | Freq: Every day | ORAL | 0 refills | Status: DC

## 2022-04-17 MED ORDER — BUPROPION HCL ER (XL) 150 MG PO TB24: 450.0000 mg | ORAL_TABLET | Freq: Every day | ORAL | 0 refills | Status: DC

## 2022-04-17 MED ORDER — AMPHETAMINE-DEXTROAMPHETAMINE 30 MG PO TABS: ORAL_TABLET | ORAL | 0 refills | Status: DC
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
## 2022-04-21 ENCOUNTER — Other Ambulatory Visit: Payer: Self-pay | Admitting: Psychiatry

## 2022-04-21 DIAGNOSIS — F419 Anxiety disorder, unspecified: Secondary | ICD-10-CM

## 2022-04-21 DIAGNOSIS — F5105 Insomnia due to other mental disorder: Secondary | ICD-10-CM

## 2022-04-21 NOTE — Telephone Encounter (Signed)
Filled 03/20/22 appt next year

## 2022-05-11 ENCOUNTER — Other Ambulatory Visit: Payer: Self-pay | Admitting: Psychiatry

## 2022-05-11 DIAGNOSIS — F411 Generalized anxiety disorder: Secondary | ICD-10-CM

## 2022-05-11 DIAGNOSIS — F5105 Insomnia due to other mental disorder: Secondary | ICD-10-CM

## 2022-05-11 DIAGNOSIS — F32A Depression, unspecified: Secondary | ICD-10-CM

## 2022-05-28 ENCOUNTER — Other Ambulatory Visit: Payer: Self-pay | Admitting: Psychiatry

## 2022-05-28 DIAGNOSIS — Z8669 Personal history of other diseases of the nervous system and sense organs: Secondary | ICD-10-CM

## 2022-06-24 DIAGNOSIS — J324 Chronic pansinusitis: Secondary | ICD-10-CM | POA: Diagnosis not present

## 2022-06-24 DIAGNOSIS — J029 Acute pharyngitis, unspecified: Secondary | ICD-10-CM | POA: Diagnosis not present

## 2022-07-13 ENCOUNTER — Telehealth: Payer: Self-pay | Admitting: Psychiatry

## 2022-07-13 DIAGNOSIS — F900 Attention-deficit hyperactivity disorder, predominantly inattentive type: Secondary | ICD-10-CM

## 2022-07-14 ENCOUNTER — Other Ambulatory Visit: Payer: Self-pay | Admitting: Psychiatry

## 2022-07-14 ENCOUNTER — Other Ambulatory Visit: Payer: Self-pay

## 2022-07-14 DIAGNOSIS — F3342 Major depressive disorder, recurrent, in full remission: Secondary | ICD-10-CM

## 2022-07-14 DIAGNOSIS — F4001 Agoraphobia with panic disorder: Secondary | ICD-10-CM

## 2022-07-14 DIAGNOSIS — F411 Generalized anxiety disorder: Secondary | ICD-10-CM

## 2022-07-14 MED ORDER — LISDEXAMFETAMINE DIMESYLATE 60 MG PO CAPS: 60.0000 mg | ORAL_CAPSULE | ORAL | 0 refills | Status: DC

## 2022-07-14 NOTE — Telephone Encounter (Signed)
Pended.

## 2022-07-14 NOTE — Telephone Encounter (Signed)
Called patient to see what pharmacy - [facility]. Told her it may require a PA.

## 2022-07-14 NOTE — Telephone Encounter (Signed)
Pt lvm that she needs a  new script that says she needs the brand name  of vyvanse since generic of out of stock

## 2022-07-16 ENCOUNTER — Other Ambulatory Visit (HOSPITAL_BASED_OUTPATIENT_CLINIC_OR_DEPARTMENT_OTHER): Payer: Self-pay

## 2022-07-16 ENCOUNTER — Encounter: Payer: Self-pay | Admitting: Psychiatry

## 2022-07-16 ENCOUNTER — Other Ambulatory Visit: Payer: Self-pay

## 2022-07-16 MED ORDER — LISDEXAMFETAMINE DIMESYLATE 60 MG PO CAPS
60.0000 mg | ORAL_CAPSULE | Freq: Every day | ORAL | 0 refills | Status: DC
  Filled 2022-07-16: qty 30, 30d supply, fill #0

## 2022-07-16 NOTE — Telephone Encounter (Signed)
Due to unavailability changed to generic vyvanse 30 mg take 2 capsules.

## 2022-07-17 ENCOUNTER — Other Ambulatory Visit (HOSPITAL_BASED_OUTPATIENT_CLINIC_OR_DEPARTMENT_OTHER): Payer: Self-pay

## 2022-07-17 NOTE — Telephone Encounter (Signed)
Pt response.

## 2022-08-10 ENCOUNTER — Other Ambulatory Visit: Payer: Self-pay | Admitting: Psychiatry

## 2022-08-10 DIAGNOSIS — F5105 Insomnia due to other mental disorder: Secondary | ICD-10-CM

## 2022-08-10 DIAGNOSIS — F411 Generalized anxiety disorder: Secondary | ICD-10-CM

## 2022-08-10 DIAGNOSIS — F32A Depression, unspecified: Secondary | ICD-10-CM

## 2022-08-11 ENCOUNTER — Other Ambulatory Visit: Payer: Self-pay | Admitting: Psychiatry

## 2022-08-11 ENCOUNTER — Telehealth: Payer: Self-pay | Admitting: Psychiatry

## 2022-08-11 DIAGNOSIS — F900 Attention-deficit hyperactivity disorder, predominantly inattentive type: Secondary | ICD-10-CM

## 2022-08-11 MED ORDER — AMPHETAMINE-DEXTROAMPHETAMINE 30 MG PO TABS: 15.0000 mg | ORAL_TABLET | Freq: Two times a day (BID) | ORAL | 0 refills | Status: DC

## 2022-08-11 MED ORDER — LISDEXAMFETAMINE DIMESYLATE 60 MG PO CAPS: 60.0000 mg | ORAL_CAPSULE | ORAL | 0 refills | Status: DC

## 2022-08-11 NOTE — Telephone Encounter (Signed)
The authorities are putting pressure on physicians about prescribing high dosages of controlled substances.  I must reduce the Adderall to 1/2 tablet twice daily.  I will send in RX for that plus Vyvanse.  This combo is still higher than normal.

## 2022-08-12 ENCOUNTER — Telehealth: Payer: Self-pay

## 2022-08-12 NOTE — Telephone Encounter (Signed)
Dari lvm requesting a new Rx for the brand name Vyvanse not generic due to cost. Next appointment scheduled for 09/21/22  Contact information # [phone]

## 2022-08-12 NOTE — Telephone Encounter (Signed)
Patient is asking for brand Vyvanse because her pharmacy is unable to get generic currently. I told her the Rx has been the same since September and she can request brand. I told her that her PA for Vyvanse 60 mg expired 08/03/22 and I would initiate a new one.

## 2022-08-13 ENCOUNTER — Other Ambulatory Visit: Payer: Self-pay | Admitting: Obstetrics and Gynecology

## 2022-08-13 DIAGNOSIS — Z1231 Encounter for screening mammogram for malignant neoplasm of breast: Secondary | ICD-10-CM

## 2022-08-14 NOTE — Telephone Encounter (Signed)
Traci to send notes.

## 2022-08-14 NOTE — Telephone Encounter (Signed)
Her office notes were faxed earlier per request

## 2022-08-21 ENCOUNTER — Ambulatory Visit
Admission: RE | Admit: 2022-08-21 | Discharge: 2022-08-21 | Disposition: A | Discharge status: Home / Self Care | Payer: BC Managed Care – PPO | Source: Ambulatory Visit | Attending: Obstetrics and Gynecology | Admitting: Obstetrics and Gynecology

## 2022-08-21 DIAGNOSIS — Z1231 Encounter for screening mammogram for malignant neoplasm of breast: Secondary | ICD-10-CM

## 2022-09-08 ENCOUNTER — Other Ambulatory Visit: Payer: Self-pay

## 2022-09-08 ENCOUNTER — Other Ambulatory Visit (HOSPITAL_COMMUNITY): Payer: Self-pay

## 2022-09-08 ENCOUNTER — Telehealth: Payer: Self-pay | Admitting: Psychiatry

## 2022-09-08 DIAGNOSIS — F900 Attention-deficit hyperactivity disorder, predominantly inattentive type: Secondary | ICD-10-CM

## 2022-09-08 MED ORDER — AMPHETAMINE-DEXTROAMPHETAMINE 30 MG PO TABS
15.0000 mg | ORAL_TABLET | Freq: Two times a day (BID) | ORAL | 0 refills | Status: DC
  Filled 2022-09-08: qty 30, 30d supply, fill #0

## 2022-09-08 NOTE — Telephone Encounter (Signed)
Pended adderall

## 2022-09-08 NOTE — Telephone Encounter (Signed)
Molly Brady lvm requesting refills on the generic Adderall and Vyvanse. Fill at the [facility] per her request.  Contact patient when Rx is sent # [phone]

## 2022-09-08 NOTE — Telephone Encounter (Signed)
Vyvanse filled 1/12 due 2/9

## 2022-09-09 ENCOUNTER — Other Ambulatory Visit (HOSPITAL_COMMUNITY): Payer: Self-pay

## 2022-09-10 ENCOUNTER — Other Ambulatory Visit (HOSPITAL_COMMUNITY): Payer: Self-pay

## 2022-09-10 DIAGNOSIS — C541 Malignant neoplasm of endometrium: Secondary | ICD-10-CM | POA: Diagnosis not present

## 2022-09-11 ENCOUNTER — Other Ambulatory Visit (HOSPITAL_COMMUNITY): Payer: Self-pay

## 2022-09-11 NOTE — Telephone Encounter (Signed)
Molly Brady called and LM today at 2:22 requesting her Vyvanse be filled today.  She has called previously about this but it has not been sent in.  Appt 2/19.  Send to [facility] on [address]

## 2022-09-14 ENCOUNTER — Other Ambulatory Visit: Payer: Self-pay

## 2022-09-14 ENCOUNTER — Other Ambulatory Visit: Payer: Self-pay | Admitting: Psychiatry

## 2022-09-14 ENCOUNTER — Other Ambulatory Visit (HOSPITAL_COMMUNITY): Payer: Self-pay

## 2022-09-14 ENCOUNTER — Telehealth: Payer: Self-pay | Admitting: Psychiatry

## 2022-09-14 DIAGNOSIS — F3342 Major depressive disorder, recurrent, in full remission: Secondary | ICD-10-CM

## 2022-09-14 DIAGNOSIS — F900 Attention-deficit hyperactivity disorder, predominantly inattentive type: Secondary | ICD-10-CM

## 2022-09-14 MED ORDER — LISDEXAMFETAMINE DIMESYLATE 60 MG PO CAPS
60.0000 mg | ORAL_CAPSULE | Freq: Every morning | ORAL | 0 refills | Status: DC
  Filled 2022-09-14: qty 30, 30d supply, fill #0

## 2022-09-14 NOTE — Telephone Encounter (Signed)
Molly Brady called again today at 10:44 to get a refill of her vyvanse.  Appt 2/19. This is the 3rd she has called requesting the refill.  [facility] to [facility] on [address]

## 2022-09-14 NOTE — Telephone Encounter (Signed)
Pended to requested pharmacy.

## 2022-09-17 DIAGNOSIS — J0100 Acute maxillary sinusitis, unspecified: Secondary | ICD-10-CM | POA: Diagnosis not present

## 2022-09-21 ENCOUNTER — Ambulatory Visit (INDEPENDENT_AMBULATORY_CARE_PROVIDER_SITE_OTHER): Payer: BC Managed Care – PPO | Admitting: Psychiatry

## 2022-09-21 ENCOUNTER — Other Ambulatory Visit (HOSPITAL_COMMUNITY): Payer: Self-pay

## 2022-09-21 ENCOUNTER — Encounter: Payer: Self-pay | Admitting: Psychiatry

## 2022-09-21 DIAGNOSIS — Z8669 Personal history of other diseases of the nervous system and sense organs: Secondary | ICD-10-CM

## 2022-09-21 DIAGNOSIS — F3342 Major depressive disorder, recurrent, in full remission: Secondary | ICD-10-CM | POA: Diagnosis not present

## 2022-09-21 DIAGNOSIS — F5105 Insomnia due to other mental disorder: Secondary | ICD-10-CM

## 2022-09-21 DIAGNOSIS — F411 Generalized anxiety disorder: Secondary | ICD-10-CM

## 2022-09-21 DIAGNOSIS — F4001 Agoraphobia with panic disorder: Secondary | ICD-10-CM

## 2022-09-21 DIAGNOSIS — F900 Attention-deficit hyperactivity disorder, predominantly inattentive type: Secondary | ICD-10-CM | POA: Diagnosis not present

## 2022-09-21 MED ORDER — AMPHETAMINE-DEXTROAMPHETAMINE 30 MG PO TABS: 15.0000 mg | ORAL_TABLET | Freq: Two times a day (BID) | ORAL | 0 refills | Status: DC

## 2022-09-21 MED ORDER — LISDEXAMFETAMINE DIMESYLATE 70 MG PO CAPS
70.0000 mg | ORAL_CAPSULE | ORAL | 0 refills | Status: DC
  Filled 2022-11-18: qty 30, 30d supply, fill #0

## 2022-09-21 MED ORDER — LISDEXAMFETAMINE DIMESYLATE 70 MG PO CAPS
70.0000 mg | ORAL_CAPSULE | ORAL | 0 refills | Status: DC
  Filled 2022-10-19: qty 30, 30d supply, fill #0

## 2022-09-21 MED ORDER — LISDEXAMFETAMINE DIMESYLATE 70 MG PO CAPS
70.0000 mg | ORAL_CAPSULE | Freq: Every day | ORAL | 0 refills | Status: DC
  Filled 2022-09-21: qty 30, 30d supply, fill #0

## 2022-09-21 MED ORDER — AMPHETAMINE-DEXTROAMPHETAMINE 30 MG PO TABS
15.0000 mg | ORAL_TABLET | Freq: Two times a day (BID) | ORAL | 0 refills | Status: DC
  Filled 2022-09-21 – 2022-10-19 (×2): qty 30, 30d supply, fill #0

## 2022-09-21 MED ORDER — AMPHETAMINE-DEXTROAMPHETAMINE 30 MG PO TABS
15.0000 mg | ORAL_TABLET | Freq: Two times a day (BID) | ORAL | 0 refills | Status: DC
  Filled 2022-11-18: qty 30, 30d supply, fill #0
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
## 2022-10-16 ENCOUNTER — Telehealth: Payer: Self-pay | Admitting: Psychiatry

## 2022-10-16 ENCOUNTER — Other Ambulatory Visit: Payer: Self-pay

## 2022-10-16 DIAGNOSIS — Z8669 Personal history of other diseases of the nervous system and sense organs: Secondary | ICD-10-CM

## 2022-10-16 MED ORDER — RIZATRIPTAN BENZOATE 10 MG PO TABS: ORAL_TABLET | ORAL | 3 refills | Status: DC

## 2022-10-16 NOTE — Telephone Encounter (Signed)
Sent!

## 2022-10-16 NOTE — Telephone Encounter (Signed)
Pt lvm that she needs a refill on her maxalt 10 mg. Pharmacy is [facility]

## 2022-10-19 ENCOUNTER — Other Ambulatory Visit (HOSPITAL_COMMUNITY): Payer: Self-pay

## 2022-10-20 ENCOUNTER — Other Ambulatory Visit (HOSPITAL_COMMUNITY): Payer: Self-pay

## 2022-11-03 ENCOUNTER — Other Ambulatory Visit: Payer: Self-pay | Admitting: Psychiatry

## 2022-11-03 DIAGNOSIS — F419 Anxiety disorder, unspecified: Secondary | ICD-10-CM

## 2022-11-03 DIAGNOSIS — F411 Generalized anxiety disorder: Secondary | ICD-10-CM

## 2022-11-03 DIAGNOSIS — F5105 Insomnia due to other mental disorder: Secondary | ICD-10-CM

## 2022-11-06 ENCOUNTER — Other Ambulatory Visit: Payer: Self-pay | Admitting: Psychiatry

## 2022-11-06 DIAGNOSIS — F5105 Insomnia due to other mental disorder: Secondary | ICD-10-CM

## 2022-11-06 DIAGNOSIS — F32A Anxiety disorder, unspecified: Secondary | ICD-10-CM

## 2022-11-18 ENCOUNTER — Other Ambulatory Visit (HOSPITAL_COMMUNITY): Payer: Self-pay

## 2022-12-10 ENCOUNTER — Encounter (HOSPITAL_BASED_OUTPATIENT_CLINIC_OR_DEPARTMENT_OTHER): Payer: Self-pay

## 2022-12-10 ENCOUNTER — Other Ambulatory Visit: Payer: Self-pay

## 2022-12-10 ENCOUNTER — Emergency Department (HOSPITAL_BASED_OUTPATIENT_CLINIC_OR_DEPARTMENT_OTHER)
Admission: EM | Admit: 2022-12-10 | Discharge: 2022-12-10 | Discharge status: Against Medical Advice | Payer: BC Managed Care – PPO | Attending: Emergency Medicine | Admitting: Emergency Medicine

## 2022-12-10 ENCOUNTER — Emergency Department (HOSPITAL_BASED_OUTPATIENT_CLINIC_OR_DEPARTMENT_OTHER): Payer: BC Managed Care – PPO

## 2022-12-10 ENCOUNTER — Encounter: Payer: Self-pay | Admitting: Internal Medicine

## 2022-12-10 ENCOUNTER — Ambulatory Visit: Payer: BC Managed Care – PPO | Admitting: Internal Medicine

## 2022-12-10 ENCOUNTER — Telehealth: Payer: Self-pay | Admitting: Internal Medicine

## 2022-12-10 VITALS — BP 124/80 | HR 94 | Temp 99.3°F | Ht 64.3 in | Wt 135.4 lb

## 2022-12-10 DIAGNOSIS — K921 Melena: Secondary | ICD-10-CM | POA: Diagnosis not present

## 2022-12-10 DIAGNOSIS — N390 Urinary tract infection, site not specified: Secondary | ICD-10-CM

## 2022-12-10 DIAGNOSIS — R399 Unspecified symptoms and signs involving the genitourinary system: Secondary | ICD-10-CM | POA: Diagnosis not present

## 2022-12-10 DIAGNOSIS — R82998 Other abnormal findings in urine: Secondary | ICD-10-CM | POA: Diagnosis not present

## 2022-12-10 DIAGNOSIS — K625 Hemorrhage of anus and rectum: Secondary | ICD-10-CM | POA: Diagnosis not present

## 2022-12-10 DIAGNOSIS — K922 Gastrointestinal hemorrhage, unspecified: Secondary | ICD-10-CM | POA: Diagnosis not present

## 2022-12-10 DIAGNOSIS — F988 Other specified behavioral and emotional disorders with onset usually occurring in childhood and adolescence: Secondary | ICD-10-CM

## 2022-12-10 DIAGNOSIS — R109 Unspecified abdominal pain: Secondary | ICD-10-CM | POA: Diagnosis not present

## 2022-12-10 DIAGNOSIS — Z8542 Personal history of malignant neoplasm of other parts of uterus: Secondary | ICD-10-CM | POA: Insufficient documentation

## 2022-12-10 DIAGNOSIS — K529 Noninfective gastroenteritis and colitis, unspecified: Secondary | ICD-10-CM | POA: Diagnosis not present

## 2022-12-10 DIAGNOSIS — N3000 Acute cystitis without hematuria: Secondary | ICD-10-CM | POA: Insufficient documentation

## 2022-12-10 DIAGNOSIS — Z5329 Procedure and treatment not carried out because of patient's decision for other reasons: Secondary | ICD-10-CM | POA: Insufficient documentation

## 2022-12-10 DIAGNOSIS — C55 Malignant neoplasm of uterus, part unspecified: Secondary | ICD-10-CM

## 2022-12-10 DIAGNOSIS — Z86010 Personal history of colonic polyps: Secondary | ICD-10-CM

## 2022-12-10 LAB — URINALYSIS, ROUTINE W REFLEX MICROSCOPIC
Bilirubin Urine: NEGATIVE
Glucose, UA: NEGATIVE mg/dL
Hgb urine dipstick: NEGATIVE
Ketones, ur: NEGATIVE mg/dL
Nitrite: NEGATIVE
Protein, ur: NEGATIVE mg/dL
Specific Gravity, Urine: <1.005 — ABNORMAL LOW (ref 1.005–1.030)
WBC, UA: >50 WBC/hpf (ref 0–5)
pH: 7 (ref 5.0–8.0)

## 2022-12-10 LAB — HEMOCCULT GUIAC POC 1CARD (OFFICE): Fecal Occult Blood, POC: POSITIVE — AB

## 2022-12-10 LAB — COMPREHENSIVE METABOLIC PANEL
ALT: 18 U/L (ref 0–44)
AST: 20 U/L (ref 15–41)
Albumin: 4.4 g/dL (ref 3.5–5.0)
Alkaline Phosphatase: 83 U/L (ref 38–126)
Anion gap: 9 (ref 5–15)
BUN: 8 mg/dL (ref 6–20)
CO2: 28 mmol/L (ref 22–32)
Calcium: 9.3 mg/dL (ref 8.9–10.3)
Chloride: 100 mmol/L (ref 98–111)
Creatinine, Ser: 1.07 mg/dL — ABNORMAL HIGH (ref 0.44–1.00)
GFR, Estimated: 59 mL/min — ABNORMAL LOW (ref >60)
Glucose, Bld: 92 mg/dL (ref 70–99)
Potassium: 3.9 mmol/L (ref 3.5–5.1)
Sodium: 137 mmol/L (ref 135–145)
Total Bilirubin: 0.3 mg/dL (ref 0.3–1.2)
Total Protein: 7.5 g/dL (ref 6.5–8.1)

## 2022-12-10 LAB — CBC
HCT: 38.1 % (ref 36.0–46.0)
Hemoglobin: 12.7 g/dL (ref 12.0–15.0)
MCH: 30.7 pg (ref 26.0–34.0)
MCHC: 33.3 g/dL (ref 30.0–36.0)
MCV: 92 fL (ref 80.0–100.0)
Platelets: 325 10*3/uL (ref 150–400)
RBC: 4.14 MIL/uL (ref 3.87–5.11)
RDW: 12.6 % (ref 11.5–15.5)
WBC: 7.9 10*3/uL (ref 4.0–10.5)
nRBC: 0 % (ref 0.0–0.2)

## 2022-12-10 LAB — POCT URINALYSIS DIPSTICK
Bilirubin, UA: NEGATIVE
Glucose, UA: NEGATIVE
Ketones, UA: NEGATIVE
Nitrite, UA: NEGATIVE
Protein, UA: NEGATIVE
Spec Grav, UA: <=1.005 — AB (ref 1.010–1.025)
Urobilinogen, UA: 0.2 E.U./dL
pH, UA: 7 (ref 5.0–8.0)

## 2022-12-10 LAB — LACTIC ACID, PLASMA: Lactic Acid, Venous: 0.7 mmol/L (ref 0.5–1.9)

## 2022-12-10 LAB — LIPASE, BLOOD: Lipase: 35 U/L (ref 11–51)

## 2022-12-10 MED ORDER — SODIUM CHLORIDE 0.9 % IV SOLN
1.0000 g | Freq: Once | INTRAVENOUS | Status: AC
  Administered 2022-12-10: 1 g via INTRAVENOUS
  Filled 2022-12-10: qty 10

## 2022-12-10 MED ORDER — SODIUM CHLORIDE 0.9 % IV BOLUS
1000.0000 mL | Freq: Once | INTRAVENOUS | Status: AC
  Administered 2022-12-10: 1000 mL via INTRAVENOUS

## 2022-12-10 MED ORDER — AMOXICILLIN-POT CLAVULANATE 875-125 MG PO TABS: 1.0000 | ORAL_TABLET | Freq: Two times a day (BID) | ORAL | 0 refills | Status: DC

## 2022-12-10 MED ORDER — IOHEXOL 350 MG/ML SOLN
100.0000 mL | Freq: Once | INTRAVENOUS | Status: AC | PRN
  Administered 2022-12-10: 80 mL via INTRAVENOUS

## 2022-12-10 NOTE — ED Provider Notes (Signed)
[place] EMERGENCY DEPARTMENT AT [facility] Provider Note   CSN: [id number] Arrival date & time: 12/10/22  1331    History  Chief Complaint  Patient presents with   Rectal Bleeding    Molly Brady is a 62 y.o. female past medical history significant for endometrial cancer migraines here for evaluation of UTI symptoms and rectal bleeding.  Noted some lower abdominal pain and dysuria on Sunday.  Monday began to notice bright red blood in her stool which has become more frequent. Mutiple episodes daily.  Passing more blood with each bowel movement however today feels like she is not fully emptying her bowels and it is more blood than stool.  No fevers, nausea, vomiting, chest pain, shortness of breath.  Followed by Dr. Loreta Ave with gastroenterology she is due for colonoscopy in August does note she had precancerous polyps removed 5 years ago on her prior colonoscopy.  No lightheadedness or dizziness.  No anticoagulation.  Seen by her PCP prior to arrival who diagnosed with UTI.  They performed a rectal exam which according to PCP note as well as patient was grossly positive for bright red blood, no hemorrhoids or fissures.  HPI     Home Medications Prior to Admission medications   Medication Sig Start Date End Date Taking? Authorizing Provider  amoxicillin-clavulanate (AUGMENTIN) 875-125 MG tablet Take 1 tablet by mouth every 12 (twelve) hours. 12/10/22  Yes Shaunie Boehm A, PA-C  ALPRAZolam (XANAX) 0.5 MG tablet TAKE 1-2 TABLETS BY MOUTH AT BEDTIME AS NEEDED 11/06/22   Cottle, Steva Ready., MD  amphetamine-dextroamphetamine (ADDERALL) 30 MG tablet Take 1/2 tablet by mouth 2 (two) times daily. 11/16/22   Lauraine Rinne., MD  buPROPion (WELLBUTRIN XL) 150 MG 24 hr tablet TAKE 3 TABLETS BY MOUTH DAILY 09/14/22   Cottle, Steva Ready., MD  gabapentin (NEURONTIN) 100 MG capsule TAKE 2 CAPSULES BY MOUTH AT BEDTIME 11/03/22   Cottle, Steva Ready., MD  ibuprofen (ADVIL) 800 MG tablet Take 1  tablet (800 mg total) by mouth every 8 (eight) hours as needed. 02/27/20   Vick Frees, MD  lisdexamfetamine (VYVANSE) 70 MG capsule Take 1 capsule (70 mg total) by mouth daily. 09/21/22   Cottle, Steva Ready., MD  lisdexamfetamine (VYVANSE) 70 MG capsule Take 1 capsule (70 mg total) by mouth every morning. 11/16/22   Cottle, Steva Ready., MD  PARoxetine (PAXIL) 10 MG tablet Take 5 mg by mouth daily. weaning    [provider]  rizatriptan (MAXALT) 10 MG tablet TAKE 1 TABLET BY MOUTH AND MAY REPEAT IN 2 HOURS AS NEEDED AS DIRECTED 10/16/22   Cottle, Steva Ready., MD      Allergies    Patient has no known allergies.    Review of Systems   Review of Systems  Constitutional: Negative.   HENT: Negative.    Respiratory: Negative.    Cardiovascular: Negative.   Gastrointestinal:  Positive for abdominal pain and blood in stool. Negative for abdominal distention, anal bleeding, constipation, diarrhea, nausea, rectal pain and vomiting.  Genitourinary: Negative.   Musculoskeletal: Negative.   Skin: Negative.   Neurological: Negative.   All other systems reviewed and are negative.   Physical Exam Updated Vital Signs BP (!) 138/110 (BP Location: Right Arm)   Pulse 86   Temp 98.7 F (37.1 C) (Oral)   Resp 18   Ht 5' 4.3" (1.633 m)   Wt 61.4 kg   LMP 08/03/2014   SpO2  98%   BMI 23.02 kg/m  Physical Exam Vitals and nursing note reviewed.  Constitutional:      General: She is not in acute distress.    Appearance: She is well-developed. She is not ill-appearing, toxic-appearing or diaphoretic.  HENT:     Head: Normocephalic and atraumatic.     Nose: Nose normal.     Mouth/Throat:     Mouth: Mucous membranes are moist.  Eyes:     Pupils: Pupils are equal, round, and reactive to light.  Cardiovascular:     Rate and Rhythm: Normal rate.     Pulses: Normal pulses.     Heart sounds: Normal heart sounds.  Pulmonary:     Effort: Pulmonary effort is normal. No respiratory  distress.     Breath sounds: Normal breath sounds.  Abdominal:     General: Bowel sounds are normal. There is no distension.     Palpations: Abdomen is soft.     Tenderness: There is abdominal tenderness.     Comments: Mild bilateral lower quadrant and suprapubic tenderness, no rebound or guarding  Genitourinary:    Comments: Declined, performed at PCP see note Musculoskeletal:        General: No swelling, tenderness, deformity or signs of injury. Normal range of motion.     Cervical back: Normal range of motion.     Right lower leg: No edema.     Left lower leg: No edema.  Skin:    General: Skin is warm and dry.     Capillary Refill: Capillary refill takes less than 2 seconds.     Comments: No edema, erythema or warmth.  No fluctuance induration  Neurological:     General: No focal deficit present.     Mental Status: She is alert and oriented to person, place, and time.  Psychiatric:        Mood and Affect: Mood normal.     ED Results / Procedures / Treatments   Labs (all labs ordered are listed, but only abnormal results are displayed) Labs Reviewed  COMPREHENSIVE METABOLIC PANEL - Abnormal; Notable for the following components:      Result Value   Creatinine, Ser 1.07 (*)    GFR, Estimated 59 (*)    All other components within normal limits  URINALYSIS, ROUTINE W REFLEX MICROSCOPIC - Abnormal; Notable for the following components:   Color, Urine COLORLESS (*)    Specific Gravity, Urine <1.005 (*)    Leukocytes,Ua MODERATE (*)    Bacteria, UA RARE (*)    All other components within normal limits  LIPASE, BLOOD  CBC  LACTIC ACID, PLASMA    EKG None  Radiology CT ANGIO GI BLEED  Result Date: 12/10/2022 CLINICAL DATA:  GI bleed EXAM: CTA ABDOMEN AND PELVIS WITHOUT AND WITH CONTRAST TECHNIQUE: Multidetector CT imaging of the abdomen and pelvis was performed using the standard protocol during bolus administration of intravenous contrast. Multiplanar reconstructed  images and MIPs were obtained and reviewed to evaluate the vascular anatomy. RADIATION DOSE REDUCTION: This exam was performed according to the departmental dose-optimization program which includes automated exposure control, adjustment of the mA and/or kV according to patient size and/or use of iterative reconstruction technique. CONTRAST:  80mL OMNIPAQUE IOHEXOL 350 MG/ML SOLN COMPARISON:  None Available. FINDINGS: VASCULAR Normal contour and caliber of the abdominal aorta, without evidence of aneurysm, dissection, or other acute aortic pathology. Small accessory inferior pole renal arteries with otherwise standard branching pattern of the abdominal aorta. Mild mixed calcific  atherosclerosis. Review of the MIP images confirms the above findings. NON-VASCULAR Lower chest: No acute abnormality. Hepatobiliary: No solid liver abnormality is seen. No gallstones, gallbladder wall thickening, or biliary dilatation. Pancreas: Unremarkable. No pancreatic ductal dilatation or surrounding inflammatory changes. Spleen: Normal in size without significant abnormality. Adrenals/Urinary Tract: Adrenal glands are unremarkable. Kidneys are normal, without renal calculi, solid lesion, or hydronephrosis. Bladder is unremarkable. Stomach/Bowel: Stomach is within normal limits. Appendix appears normal. Mild wall thickening and inflammatory fat stranding about the decompressed descending and sigmoid colon (series 13, image 139). No evidence of intraluminal contrast extravasation. Lymphatic: No significant vascular findings are present. No enlarged abdominal or pelvic lymph nodes. Reproductive: Status post hysterectomy. Other: No abdominal wall hernia or abnormality. No ascites. Musculoskeletal: No acute or significant osseous findings. IMPRESSION: 1. Normal contour and caliber of the abdominal aorta, without evidence of aneurysm, dissection, or other acute aortic pathology. Mild mixed calcific atherosclerosis. 2. Mild wall thickening  and inflammatory fat stranding about the decompressed descending and sigmoid colon, consistent with nonspecific infectious, inflammatory, or ischemic colitis. No evidence of intraluminal contrast extravasation to specifically localize GI bleeding. Electronically Signed   By: Jearld Lesch M.D.   On: 12/10/2022 16:02    Procedures Procedures    Medications Ordered in ED Medications  iohexol (OMNIPAQUE) 350 MG/ML injection 100 mL (80 mLs Intravenous Contrast Given 12/10/22 1517)  cefTRIAXone (ROCEPHIN) 1 g in sodium chloride 0.9 % 100 mL IVPB (0 g Intravenous Stopped 12/10/22 1740)  sodium chloride 0.9 % bolus 1,000 mL (0 mLs Intravenous Stopped 12/10/22 1812)    ED Course/ Medical Decision Making/ A&P    62 year old female prior history of endometrial cancer s/p total hysterectomy here for evaluation of UTI symptoms and blood in stool.  Symptoms began on Sunday, rectal bleeding on Monday.  More blood in stool as time has passed.  Today felt like he was not really passing much stool however was having some persistent bright red blood.  She is not anticoagulated.  Has some lower abdominal cramping.  Her last colonoscopy was just under 5 years ago where she had precancerous polyps removed by Dr. Loreta Ave. Pt deferred rectal exam due to just being performed at PCP office, per their note was grossly positive for bright red blood on exam, no hemorrhoids or fissures.  Urine was positive for UTI at PCP will plan on labs, imaging and reassess.  Patient declines need for pain medicine at this time  Labs and imaging personally viewed and interpreted:  UA positive for UTI CBC without leukocytosis, hemoglobin 12.7>> baseline appears to be 13 CMP creatinine 1.07 Lipase 35 CT GI bleed colitis, infectious versus inflammatory versus ischemic Lactic 0.7  I discussed results with patient, family in room.  I recommended admission for further workup and GI consult.  Plan for admission  I was called by nursing staff as  patient states she no longer wanted admission to the hospital.  We discussed risk versus benefit including worsening condition, death.  She voiced understanding.  She is leaving AGAINST MEDICAL ADVICE.  I have written for antibiotics that will treat UTI as well as possible infectious colitis.  She is encouraged to follow-up with GI, return if she changes her mind for admission or symptoms worsen  We discussed the nature and purpose, risks and benefits, as well as, the alternatives of treatment. Time was given to allow the opportunity to ask questions and consider their options, and after the discussion, the patient decided to refuse the offerred treatment.  The patient was informed that refusal could lead to, but was not limited to, death, permanent disability, or severe pain. If present, I asked the relatives or significant others to dissuade them without success. Prior to refusing, I determined that the patient had the capacity to make their decision and understood the consequences of that decision. After refusal, I made every reasonable opportunity to treat them to the best of my ability.  The patient was notified that they may return to the emergency department at any time for further treatment.                               Medical Decision Making Amount and/or Complexity of Data Reviewed Independent Historian:     Details: Family in room External Data Reviewed: labs, radiology and notes. Labs: ordered. Decision-making details documented in ED Course. Radiology: ordered and independent interpretation performed. Decision-making details documented in ED Course.  Risk OTC drugs. Prescription drug management. Parenteral controlled substances. Decision regarding hospitalization. Diagnosis or treatment significantly limited by social determinants of health.          Final Clinical Impression(s) / ED Diagnoses Final diagnoses:  Gastrointestinal hemorrhage, unspecified gastrointestinal  hemorrhage type  Acute cystitis without hematuria  Colitis    Rx / DC Orders ED Discharge Orders          Ordered    amoxicillin-clavulanate (AUGMENTIN) 875-125 MG tablet  Every 12 hours        12/10/22 1805              Delane Stalling A, PA-C 12/10/22 2335    Terald Sleeper, MD 12/11/22 [phone]

## 2022-12-10 NOTE — ED Notes (Signed)
Patient transported to CT 

## 2022-12-10 NOTE — Patient Instructions (Signed)
Referral to [facility] emergency department for evaluation.  Needs urgent CT of abdomen and pelvis and evaluation in the emergency department labs etc.

## 2022-12-10 NOTE — Telephone Encounter (Signed)
Molly Brady [phone]  [name] called to say she started having urine frequency, pain, then she started having some blood in her stool and blood every time she wipes plus abdominal cramps for the last 5 days. I scheduled her to come in at 12:30.

## 2022-12-10 NOTE — Progress Notes (Addendum)
Patient Care Team: Margaree Mackintosh, MD as PCP - General (Internal Medicine)  Visit Date: 12/10/22  Subjective:    Patient ID: Molly Brady , Female   DOB: [birth year], 62 y.o.    MRN: [id number]   62 y.o. Female presents today for dysuria that started 12/06/22 in the evening after urinating. Last seen here in 2021.   Abdominal bloating, diarrhea, blood in stool started on 12/07/22. She believes she is not passing enough stool in relation to her food intake. Denies fever, shaking chills, nausea, vomiting. Small amouts of stool with blood coming out she says. No fever, shaking chills, vomiting  Gastroenterologist is Dr. Charna Elizabeth. Had colonoscopy August 2019 with one small polyp removed and next study due August 2024. Path showed fragments of tubular adenoma.  History of stage IA grade 1 endometrioid adenocarcinoma. Status post total laparoscopic hysterectomy, bilateral salpingo-oophorectomy, sentinel node sampling on 03/26/20. 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   Hx attention deficit disorder seen by Dr. Jennelle Human who has also seen her for depression.  History of hyperlipidemia with elevated LDL between 110 and 130 in the past but not checked recently.  Did not want to be on statin at that time.  PMH: History of umbilical hernia repair in 1963.  Right arm fracture twice in childhood.  Fractured foot from a fall down stairs and fractured her ankle twice.  1 pregnancy.  History of migraine headaches treated with Maxalt.  Social history: Health and safety inspector.  She is divorced.  Does not smoke or consume alcohol.  Family history: Grandmother and maternal aunt with history of GYN cancer.  Father died of lung cancer at age 110.  Mother died of lung cancer at age 59.  2 sisters in good health.  1 daughter in her 30s in good health.  1 brother in good health.  Past Medical History:  Diagnosis Date   ADHD    Anemia    none recent   Anxiety and depression     Elevated liver enzymes 2019   resolved 2019 related to ibuprofen use   Endometrial polyp    bleeding/spotting x  2-3 months   Migraines      Family History  Problem Relation Age of Onset   Lung cancer Mother    Asthma Mother    Lung cancer Father    Asthma Sister    Breast cancer Maternal Aunt    Breast cancer Maternal Grandmother     Social History   Social History Narrative   Not on file      Review of Systems  Constitutional:  Negative for fever and malaise/fatigue.  HENT:  Negative for congestion.   Eyes:  Negative for blurred vision.  Respiratory:  Negative for cough and shortness of breath.   Cardiovascular:  Negative for chest pain, palpitations and leg swelling.  Gastrointestinal:  Positive for abdominal pain (Bloating), blood in stool and diarrhea. Negative for nausea and vomiting.  Genitourinary:  Positive for dysuria.  Musculoskeletal:  Negative for back pain.  Skin:  Negative for rash.  Neurological:  Negative for loss of consciousness and headaches.        Objective:   Vitals: BP 124/80   Pulse 94   Temp 99.3 F (37.4 C) (Tympanic)   Ht 5' 4.3" (1.633 m)   Wt 135 lb 6.4 oz (61.4 kg)   LMP 08/03/2014   SpO2 98%   BMI 23.02 kg/m    Physical Exam Vitals and  nursing note reviewed.  Constitutional:      General: She is not in acute distress.    Appearance: Normal appearance. She is not toxic-appearing.  HENT:     Head: Normocephalic and atraumatic.  Pulmonary:     Effort: Pulmonary effort is normal.  Abdominal:     General: There is distension.     Tenderness: There is abdominal tenderness.     Comments: Abdomen slightly distended. Mild diffuse abdominal tenderness.  Genitourinary:    Comments: Loose brown stool with bright red blood mixed throughout. Strongly guaiac positive on hemoccult testing. Skin:    General: Skin is warm and dry.  Neurological:     Mental Status: She is alert and oriented to person, place, and time. Mental  status is at baseline.  Psychiatric:        Mood and Affect: Mood normal.        Behavior: Behavior normal.        Thought Content: Thought content normal.        Judgment: Judgment normal.       Results:   Studies obtained and personally reviewed by me:    Labs: Urine specimen today is abnormal and was sent for culture.  Had 2+ LE on urine dipstick      Component Value Date/Time   NA 138 02/12/2020 1017   K 4.4 02/12/2020 1017   CL 105 02/12/2020 1017   CO2 28 02/12/2020 1017   GLUCOSE 108 (H) 02/12/2020 1017   BUN 17 02/12/2020 1017   CREATININE 0.91 02/12/2020 1017   CALCIUM 9.2 02/12/2020 1017   PROT 7.1 02/12/2020 1017   ALBUMIN 4.4 04/29/2019 1848   AST 31 02/12/2020 1017   ALT 37 (H) 02/12/2020 1017   ALKPHOS 87 04/29/2019 1848   BILITOT 0.4 02/12/2020 1017   GFRNONAA 70 02/12/2020 1017   GFRAA 81 02/12/2020 1017     Lab Results  Component Value Date   WBC 5.0 02/12/2020   HGB 13.3 02/12/2020   HCT 40.6 02/12/2020   MCV 91.0 02/12/2020   PLT 327 02/12/2020    Lab Results  Component Value Date   CHOL 258 (H) 02/12/2020   HDL 78 02/12/2020   LDLCALC 161 (H) 02/12/2020   TRIG 84 02/12/2020   CHOLHDL 3.3 02/12/2020    No results found for: "HGBA1C"   Lab Results  Component Value Date   TSH 1.36 02/12/2020      Assessment & Plan:   Acute UTI: urinalysis showed moderate leukocytes. Will prescribe antibiotic after she has been evaluated for her abdominal symptoms. Urine sent for culture.  Diarrhea, blood in stool, abdominal bloating: given her history of endometrioid adenocarcinoma she has been sent to [facility] ED for further evaluation and testing.  I am concerned she has an acute gastroenteritis and needs urgent evaluation.  She also appears to have a UTI that needs to be treated.  She needs further imaging studies such as CT of abdomen and pelvis.  History of endometrial carcinoma was treated at [facility] surgically in 2019.  Continues to be  followed there in GYN cancer clinic.  Last seen in February 2024 and thought to be doing well.  History of stageI A , grade 1 endometrioid adenocarcinoma treated with total laparoscopic hysterectomy, BSO, sentinel node sampling on 03/26/2020.  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 Charna Elizabeth  I,Alexander Ruley,acting as a scribe  for Margaree Mackintosh, MD.,have documented all relevant documentation on the behalf of Margaree Mackintosh, MD,as directed by  Margaree Mackintosh, MD while in the presence of Margaree Mackintosh, MD.   I, Margaree Mackintosh, MD, have reviewed all documentation for this visit. The documentation on 12/10/22 for the exam, diagnosis, procedures, and orders are all accurate and complete.   Addendum: Friday May 10 10:30 am We contacted Dr. Elnoria Howard, gastroenterologist on call for Dr. Loreta Ave and he advises pt to return to hospital now. I called pt and she expresses reluctance to go. Says she is a bit more distneded with no BM is a few days. She is considering going to [facility] where her Oncology team is. Urged her to seek emergency care right away. MJB, MD

## 2022-12-10 NOTE — Addendum Note (Signed)
Addended by: Mary Sella D on: 12/10/2022 03:58 PM   Modules accepted: Orders

## 2022-12-10 NOTE — Discharge Instructions (Addendum)
Please make sure to call Dr. Elnoria Howard tomorrow.  You for an antibiotic that will cover you for an infectious colitis as well as urinary tract infection.  Keep a close eye on your bleeding, return if you change your mind about admission

## 2022-12-10 NOTE — ED Triage Notes (Signed)
Patient here POV from Home.  Endorses recent UTI with Frequency and Urgency. Monday began to note a Small tiny grey GM. Blood noted (more Dark and [employer]). Noted continued Blood in BM since especially today.   No N/V/D. No Fevers.   NAD Noted during Triage. A&Ox4. GCS 15. Ambulatory.

## 2022-12-11 ENCOUNTER — Other Ambulatory Visit: Payer: Self-pay | Admitting: Psychiatry

## 2022-12-11 ENCOUNTER — Telehealth: Payer: Self-pay | Admitting: Internal Medicine

## 2022-12-11 DIAGNOSIS — G43909 Migraine, unspecified, not intractable, without status migrainosus: Secondary | ICD-10-CM | POA: Diagnosis not present

## 2022-12-11 DIAGNOSIS — R109 Unspecified abdominal pain: Secondary | ICD-10-CM | POA: Diagnosis not present

## 2022-12-11 DIAGNOSIS — K921 Melena: Secondary | ICD-10-CM | POA: Diagnosis not present

## 2022-12-11 DIAGNOSIS — K625 Hemorrhage of anus and rectum: Secondary | ICD-10-CM | POA: Diagnosis not present

## 2022-12-11 DIAGNOSIS — Z8542 Personal history of malignant neoplasm of other parts of uterus: Secondary | ICD-10-CM | POA: Diagnosis not present

## 2022-12-11 DIAGNOSIS — Z90722 Acquired absence of ovaries, bilateral: Secondary | ICD-10-CM | POA: Diagnosis not present

## 2022-12-11 DIAGNOSIS — Z8049 Family history of malignant neoplasm of other genital organs: Secondary | ICD-10-CM | POA: Diagnosis not present

## 2022-12-11 DIAGNOSIS — Z90710 Acquired absence of both cervix and uterus: Secondary | ICD-10-CM | POA: Diagnosis not present

## 2022-12-11 DIAGNOSIS — F900 Attention-deficit hyperactivity disorder, predominantly inattentive type: Secondary | ICD-10-CM

## 2022-12-11 DIAGNOSIS — Z79899 Other long term (current) drug therapy: Secondary | ICD-10-CM | POA: Diagnosis not present

## 2022-12-11 DIAGNOSIS — F3342 Major depressive disorder, recurrent, in full remission: Secondary | ICD-10-CM

## 2022-12-11 DIAGNOSIS — E785 Hyperlipidemia, unspecified: Secondary | ICD-10-CM | POA: Diagnosis not present

## 2022-12-11 NOTE — Telephone Encounter (Signed)
Called Dr Mechele Dawley Mann's office to get urgent appointment for patient to find out the Dr Loreta Ave is out of the office till end of the month and Dr Elnoria Howard is at the Hospital. The office is going to call office back with advice on what to do.

## 2022-12-11 NOTE — Telephone Encounter (Signed)
Molly Brady called back from Dr Rolla Etienne office and she had talked with him, even thru he was working at the hospital and he wants the patient to go back to the emergency room, to be admitted like she was advised last night, because there is nothing they can do for her in the office.

## 2022-12-12 DIAGNOSIS — R109 Unspecified abdominal pain: Secondary | ICD-10-CM | POA: Diagnosis not present

## 2022-12-12 LAB — URINE CULTURE
MICRO NUMBER:: 14934992
SPECIMEN QUALITY:: ADEQUATE

## 2022-12-13 ENCOUNTER — Telehealth: Payer: Self-pay | Admitting: Internal Medicine

## 2022-12-13 MED ORDER — CIPROFLOXACIN HCL 250 MG PO TABS: 250.0000 mg | ORAL_TABLET | Freq: Two times a day (BID) | ORAL | 0 refills | Status: DC

## 2022-12-13 NOTE — Telephone Encounter (Signed)
Patient went to [facility] ED and left there yesterday morning feeling better after having a BM she says. We will research records on Care Everywhere. We have found a UTI based on evaluation in ED here and it is not sensitive to Augmentin which was prescribed in ED here. We will switch to Cipro 250 ng twice daily x 7 days. She wants to wait until late May to see Dr. Loreta Ave and was told at [facility]  that she needed colonoscopy in follow up of this event. MJB, MD

## 2022-12-14 ENCOUNTER — Telehealth: Payer: Self-pay | Admitting: Internal Medicine

## 2022-12-14 NOTE — Telephone Encounter (Signed)
scheduled

## 2022-12-14 NOTE — Telephone Encounter (Signed)
Someone from Dr Kenna Gilbert office called to see what patient had done over the weekend. I let her know that patient had gone to [facility] emergency room, and that Dr Lenord Fellers had changed her antibiotic on Sunday. Also let her know that patient only wanted to see Dr Loreta Ave. She stated she would pull up the notes from [facility] and Dr Lenord Fellers and put on DR [facility] desk because she was actually in the office today doing procedures. She talk like Dr Loreta Ave may call patient herself.

## 2022-12-14 NOTE — Telephone Encounter (Signed)
-----   Message from Margaree Mackintosh, MD sent at 12/13/2022  5:21 PM EDT ----- I have spoken with patient. She went to [facility] and was evaluated in ED but left ED at [facility] yesterday feeling better.Please see if you can pull up records from [place]. She needs follow up here Thursday morning with labs and a urine specimen. We have found that she has a UTI based on our culture and she has to change antibiotics. I am changing her to Cipro 250 mg twice daily for 7 days send today(Sunday) to [facility] per her reqeust.

## 2022-12-14 NOTE — Telephone Encounter (Signed)
LVM to CB to schedule appointment  follow up here Thursday morning with labs and a urine specimen

## 2022-12-17 ENCOUNTER — Ambulatory Visit: Payer: BC Managed Care – PPO | Admitting: Internal Medicine

## 2022-12-17 VITALS — BP 130/86 | HR 79 | Temp 97.9°F | Ht 64.3 in | Wt 135.8 lb

## 2022-12-17 DIAGNOSIS — Z86010 Personal history of colonic polyps: Secondary | ICD-10-CM

## 2022-12-17 DIAGNOSIS — A498 Other bacterial infections of unspecified site: Secondary | ICD-10-CM

## 2022-12-17 DIAGNOSIS — K529 Noninfective gastroenteritis and colitis, unspecified: Secondary | ICD-10-CM

## 2022-12-17 DIAGNOSIS — K922 Gastrointestinal hemorrhage, unspecified: Secondary | ICD-10-CM | POA: Diagnosis not present

## 2022-12-17 DIAGNOSIS — Z0189 Encounter for other specified special examinations: Secondary | ICD-10-CM

## 2022-12-17 DIAGNOSIS — C55 Malignant neoplasm of uterus, part unspecified: Secondary | ICD-10-CM

## 2022-12-17 LAB — POCT URINALYSIS DIPSTICK
Bilirubin, UA: NEGATIVE
Blood, UA: NEGATIVE
Glucose, UA: NEGATIVE
Ketones, UA: NEGATIVE
Leukocytes, UA: NEGATIVE
Nitrite, UA: NEGATIVE
Protein, UA: NEGATIVE
Spec Grav, UA: 1.01 (ref 1.010–1.025)
Urobilinogen, UA: 0.2 E.U./dL
pH, UA: 7 (ref 5.0–8.0)

## 2022-12-17 NOTE — Telephone Encounter (Signed)
Misty Stanley from Dr Kenna Gilbert office did call to let me know that Jalexia had an appointment with Dr Loreta Ave on 01/05/2023 and when she looks at her schedule she should be able to get her in sooner than that.

## 2022-12-17 NOTE — Progress Notes (Signed)
Patient Care Team: Margaree Mackintosh, MD as PCP - General (Internal Medicine)  Visit Date: 12/17/22  Subjective:    Patient ID: Molly Brady , Female   DOB: [birth year], 62 y.o.    MRN: [id number]   62 y.o. Female presents today for a hospital/ED follow-up. She was seen in the ED on 12/11/22 for bright red rectal bleeding and abdominal pain that had been ongoing for several days. CT abdomen pelvis showed colitis. Started on Augmentin. Told she needs to be admitted, which she declined. She wanted to go to [facility] for workup due to her endometrial cancer treatment being done there. She plans to see Dr. Loreta Ave, gastroenterologist, on 01/05/23. Compliant with Augmentin, ciprofloxacin. Her abdominal pain has resolved. Denies diarrhea, back pain, urinary symptoms, fever, chills, nausea, vomiting. She has bee on a soft  diet. WBC, hemoglobin, magnesium normal 12/12/22. Creatinine slightly elevated on 12/11/22, improved on 12/12/22.  Past Medical History:  Diagnosis Date   ADHD    Anemia    none recent   Anxiety and depression    Elevated liver enzymes 2019   resolved 2019 related to ibuprofen use   Endometrial polyp    bleeding/spotting x  2-3 months   Migraines      Family History  Problem Relation Age of Onset   Lung cancer Mother    Asthma Mother    Lung cancer Father    Asthma Sister    Breast cancer Maternal Aunt    Breast cancer Maternal Grandmother     Social and family hx please see previous note     Review of Systems  Constitutional:  Negative for chills, fever and malaise/fatigue.  HENT:  Negative for congestion.   Eyes:  Negative for blurred vision.  Respiratory:  Negative for cough and shortness of breath.   Cardiovascular:  Negative for chest pain, palpitations and leg swelling.  Gastrointestinal:  Negative for abdominal pain, nausea and vomiting.  Genitourinary:  Negative for dysuria, flank pain, frequency, hematuria and urgency.  Musculoskeletal:  Negative for back pain.   Skin:  Negative for rash.  Neurological:  Negative for loss of consciousness and headaches.        Objective:   Vitals: BP 130/86   Pulse 79   Temp 97.9 F (36.6 C) (Tympanic)   Ht 5' 4.3" (1.633 m)   Wt 135 lb 12.8 oz (61.6 kg)   LMP 08/03/2014   SpO2 95%   BMI 23.09 kg/m    Physical Exam Vitals and nursing note reviewed.  Constitutional:      General: She is not in acute distress.    Appearance: Normal appearance. She is not toxic-appearing.  HENT:     Head: Normocephalic and atraumatic.  Cardiovascular:     Rate and Rhythm: Normal rate and regular rhythm. No extrasystoles are present.    Pulses: Normal pulses.     Heart sounds: Normal heart sounds. No murmur heard.    No friction rub. No gallop.  Pulmonary:     Effort: Pulmonary effort is normal. No respiratory distress.     Breath sounds: Normal breath sounds. No wheezing or rales.  Abdominal:     General: Abdomen is flat. There is no distension.     Palpations: There is no hepatomegaly, splenomegaly or mass.     Tenderness: There is no abdominal tenderness.  Skin:    General: Skin is warm and dry.  Neurological:     Mental Status: She is alert and oriented  to person, place, and time. Mental status is at baseline.  Psychiatric:        Mood and Affect: Mood normal.        Behavior: Behavior normal.        Thought Content: Thought content normal.        Judgment: Judgment normal.       Results:   Studies obtained and personally reviewed by me:   Labs:       Component Value Date/Time   NA 137 12/10/2022 1349   K 3.9 12/10/2022 1349   CL 100 12/10/2022 1349   CO2 28 12/10/2022 1349   GLUCOSE 92 12/10/2022 1349   BUN 8 12/10/2022 1349   CREATININE 1.07 (H) 12/10/2022 1349   CREATININE 0.91 02/12/2020 1017   CALCIUM 9.3 12/10/2022 1349   PROT 7.5 12/10/2022 1349   ALBUMIN 4.4 12/10/2022 1349   AST 20 12/10/2022 1349   ALT 18 12/10/2022 1349   ALKPHOS 83 12/10/2022 1349   BILITOT 0.3  12/10/2022 1349   GFRNONAA 59 (L) 12/10/2022 1349   GFRNONAA 70 02/12/2020 1017   GFRAA 81 02/12/2020 1017     Lab Results  Component Value Date   WBC 7.9 12/10/2022   HGB 12.7 12/10/2022   HCT 38.1 12/10/2022   MCV 92.0 12/10/2022   PLT 325 12/10/2022    Lab Results  Component Value Date   CHOL 258 (H) 02/12/2020   HDL 78 02/12/2020   LDLCALC 161 (H) 02/12/2020   TRIG 84 02/12/2020   CHOLHDL 3.3 02/12/2020    No results found for: "HGBA1C"   Lab Results  Component Value Date   TSH 1.36 02/12/2020      Assessment & Plan:   Citrobacter freundii infection: urinalysis negative. Urinary symptoms are resolved. Contact us if symptoms recur.  Abdominal pain: symptoms resolved. She will be following-up with Dr. Loreta Ave.  Ordered CBC with Diff/Plat, CMET.  Note it is my understanding she was not admitted to [facility] but had extensive evaluation in ED and was subsequently sent home as she declined admission.  I,Alexander Ruley,acting as a Neurosurgeon for Margaree Mackintosh, MD.,have documented all relevant documentation on the behalf of Margaree Mackintosh, MD,as directed by  Margaree Mackintosh, MD while in the presence of Margaree Mackintosh, MD.   I, Margaree Mackintosh, MD, have reviewed all documentation for this visit. The documentation on 12/19/22 for the exam, diagnosis, procedures, and orders are all accurate and complete.

## 2022-12-19 ENCOUNTER — Emergency Department (HOSPITAL_COMMUNITY): Payer: BC Managed Care – PPO

## 2022-12-19 ENCOUNTER — Encounter (HOSPITAL_COMMUNITY): Payer: Self-pay

## 2022-12-19 ENCOUNTER — Inpatient Hospital Stay (HOSPITAL_COMMUNITY)
Admission: EM | Admit: 2022-12-19 | Discharge: 2022-12-23 | DRG: 386 | Disposition: A | Discharge status: Home / Self Care | Payer: BC Managed Care – PPO | Attending: Internal Medicine | Admitting: Internal Medicine

## 2022-12-19 ENCOUNTER — Encounter: Payer: Self-pay | Admitting: Internal Medicine

## 2022-12-19 ENCOUNTER — Other Ambulatory Visit: Payer: Self-pay

## 2022-12-19 DIAGNOSIS — Z90710 Acquired absence of both cervix and uterus: Secondary | ICD-10-CM

## 2022-12-19 DIAGNOSIS — Z803 Family history of malignant neoplasm of breast: Secondary | ICD-10-CM | POA: Diagnosis not present

## 2022-12-19 DIAGNOSIS — Z801 Family history of malignant neoplasm of trachea, bronchus and lung: Secondary | ICD-10-CM

## 2022-12-19 DIAGNOSIS — Z87440 Personal history of urinary (tract) infections: Secondary | ICD-10-CM

## 2022-12-19 DIAGNOSIS — K529 Noninfective gastroenteritis and colitis, unspecified: Secondary | ICD-10-CM | POA: Diagnosis not present

## 2022-12-19 DIAGNOSIS — Z79899 Other long term (current) drug therapy: Secondary | ICD-10-CM

## 2022-12-19 DIAGNOSIS — R109 Unspecified abdominal pain: Secondary | ICD-10-CM | POA: Diagnosis not present

## 2022-12-19 DIAGNOSIS — F419 Anxiety disorder, unspecified: Secondary | ICD-10-CM | POA: Diagnosis present

## 2022-12-19 DIAGNOSIS — F909 Attention-deficit hyperactivity disorder, unspecified type: Secondary | ICD-10-CM | POA: Diagnosis present

## 2022-12-19 DIAGNOSIS — R197 Diarrhea, unspecified: Secondary | ICD-10-CM | POA: Diagnosis not present

## 2022-12-19 DIAGNOSIS — K921 Melena: Secondary | ICD-10-CM | POA: Diagnosis present

## 2022-12-19 DIAGNOSIS — K922 Gastrointestinal hemorrhage, unspecified: Principal | ICD-10-CM

## 2022-12-19 DIAGNOSIS — G43909 Migraine, unspecified, not intractable, without status migrainosus: Secondary | ICD-10-CM | POA: Diagnosis not present

## 2022-12-19 DIAGNOSIS — F418 Other specified anxiety disorders: Secondary | ICD-10-CM | POA: Diagnosis not present

## 2022-12-19 DIAGNOSIS — Z8542 Personal history of malignant neoplasm of other parts of uterus: Secondary | ICD-10-CM | POA: Diagnosis not present

## 2022-12-19 DIAGNOSIS — R1084 Generalized abdominal pain: Secondary | ICD-10-CM | POA: Diagnosis not present

## 2022-12-19 DIAGNOSIS — Z602 Problems related to living alone: Secondary | ICD-10-CM | POA: Diagnosis present

## 2022-12-19 DIAGNOSIS — R933 Abnormal findings on diagnostic imaging of other parts of digestive tract: Secondary | ICD-10-CM | POA: Diagnosis not present

## 2022-12-19 DIAGNOSIS — K5190 Ulcerative colitis, unspecified, without complications: Principal | ICD-10-CM | POA: Diagnosis present

## 2022-12-19 DIAGNOSIS — R5383 Other fatigue: Secondary | ICD-10-CM | POA: Diagnosis present

## 2022-12-19 DIAGNOSIS — Z825 Family history of asthma and other chronic lower respiratory diseases: Secondary | ICD-10-CM

## 2022-12-19 DIAGNOSIS — K51019 Ulcerative (chronic) pancolitis with unspecified complications: Secondary | ICD-10-CM | POA: Diagnosis not present

## 2022-12-19 DIAGNOSIS — F32A Depression, unspecified: Secondary | ICD-10-CM | POA: Diagnosis not present

## 2022-12-19 DIAGNOSIS — D62 Acute posthemorrhagic anemia: Secondary | ICD-10-CM | POA: Diagnosis not present

## 2022-12-19 DIAGNOSIS — R0602 Shortness of breath: Secondary | ICD-10-CM | POA: Diagnosis not present

## 2022-12-19 DIAGNOSIS — R9431 Abnormal electrocardiogram [ECG] [EKG]: Secondary | ICD-10-CM | POA: Diagnosis not present

## 2022-12-19 DIAGNOSIS — D649 Anemia, unspecified: Secondary | ICD-10-CM | POA: Diagnosis not present

## 2022-12-19 LAB — TYPE AND SCREEN
ABO/RH(D): A POS
Antibody Screen: NEGATIVE

## 2022-12-19 LAB — COMPREHENSIVE METABOLIC PANEL
ALT: 22 U/L (ref 0–44)
AST: 24 U/L (ref 15–41)
Albumin: 3.5 g/dL (ref 3.5–5.0)
Alkaline Phosphatase: 91 U/L (ref 38–126)
Anion gap: 11 (ref 5–15)
BUN: 11 mg/dL (ref 6–20)
CO2: 25 mmol/L (ref 22–32)
Calcium: 9 mg/dL (ref 8.9–10.3)
Chloride: 101 mmol/L (ref 98–111)
Creatinine, Ser: 1.05 mg/dL — ABNORMAL HIGH (ref 0.44–1.00)
GFR, Estimated: >60 mL/min (ref >60)
Glucose, Bld: 96 mg/dL (ref 70–99)
Potassium: 4 mmol/L (ref 3.5–5.1)
Sodium: 137 mmol/L (ref 135–145)
Total Bilirubin: 0.1 mg/dL — ABNORMAL LOW (ref 0.3–1.2)
Total Protein: 6.9 g/dL (ref 6.5–8.1)

## 2022-12-19 LAB — CBC
HCT: 39.7 % (ref 36.0–46.0)
Hemoglobin: 12.9 g/dL (ref 12.0–15.0)
MCH: 29.7 pg (ref 26.0–34.0)
MCHC: 32.5 g/dL (ref 30.0–36.0)
MCV: 91.5 fL (ref 80.0–100.0)
Platelets: 370 10*3/uL (ref 150–400)
RBC: 4.34 MIL/uL (ref 3.87–5.11)
RDW: 12.7 % (ref 11.5–15.5)
WBC: 8.9 10*3/uL (ref 4.0–10.5)
nRBC: 0 % (ref 0.0–0.2)

## 2022-12-19 LAB — POC OCCULT BLOOD, ED: Fecal Occult Bld: POSITIVE — AB

## 2022-12-19 LAB — TROPONIN I (HIGH SENSITIVITY): Troponin I (High Sensitivity): 4 ng/L (ref <18)

## 2022-12-19 MED ORDER — ALPRAZOLAM 0.5 MG PO TABS
0.5000 mg | ORAL_TABLET | Freq: Every evening | ORAL | Status: DC | PRN
  Administered 2022-12-20: 0.5 mg via ORAL
  Filled 2022-12-19: qty 1

## 2022-12-19 MED ORDER — AMPHETAMINE-DEXTROAMPHETAMINE 30 MG PO TABS: 15.0000 mg | ORAL_TABLET | Freq: Two times a day (BID) | ORAL | Status: DC

## 2022-12-19 MED ORDER — BUPROPION HCL ER (XL) 150 MG PO TB24
450.0000 mg | ORAL_TABLET | Freq: Every day | ORAL | Status: DC
  Administered 2022-12-20 – 2022-12-23 (×4): 450 mg via ORAL
  Filled 2022-12-19: qty 1
  Filled 2022-12-19: qty 3
  Filled 2022-12-19 (×2): qty 1

## 2022-12-19 MED ORDER — GABAPENTIN 100 MG PO CAPS
200.0000 mg | ORAL_CAPSULE | Freq: Every day | ORAL | Status: DC
  Administered 2022-12-20: 200 mg via ORAL
  Filled 2022-12-19 (×2): qty 2

## 2022-12-19 MED ORDER — KETOROLAC TROMETHAMINE 30 MG/ML IJ SOLN
30.0000 mg | Freq: Four times a day (QID) | INTRAMUSCULAR | Status: DC | PRN
  Administered 2022-12-19 – 2022-12-20 (×3): 30 mg via INTRAVENOUS
  Filled 2022-12-19 (×3): qty 1

## 2022-12-19 MED ORDER — SODIUM CHLORIDE 0.45 % IV SOLN: INTRAVENOUS | Status: DC

## 2022-12-19 MED ORDER — HEPARIN SODIUM (PORCINE) 5000 UNIT/ML IJ SOLN
5000.0000 [IU] | Freq: Three times a day (TID) | INTRAMUSCULAR | Status: DC
  Administered 2022-12-19 – 2022-12-20 (×3): 5000 [IU] via SUBCUTANEOUS
  Filled 2022-12-19 (×3): qty 1

## 2022-12-19 MED ORDER — ACETAMINOPHEN 650 MG RE SUPP: 650.0000 mg | Freq: Four times a day (QID) | RECTAL | Status: DC | PRN

## 2022-12-19 MED ORDER — LISDEXAMFETAMINE DIMESYLATE 70 MG PO CAPS
70.0000 mg | ORAL_CAPSULE | Freq: Every day | ORAL | Status: DC
  Administered 2022-12-20 – 2022-12-23 (×4): 70 mg via ORAL
  Filled 2022-12-19 (×4): qty 1

## 2022-12-19 MED ORDER — SUMATRIPTAN SUCCINATE 100 MG PO TABS: 100.0000 mg | ORAL_TABLET | ORAL | Status: DC | PRN

## 2022-12-19 MED ORDER — AMPHETAMINE-DEXTROAMPHETAMINE 30 MG PO TABS: 15.0000 mg | ORAL_TABLET | Freq: Every day | ORAL | Status: DC | PRN

## 2022-12-19 MED ORDER — IOHEXOL 350 MG/ML SOLN
75.0000 mL | Freq: Once | INTRAVENOUS | Status: AC | PRN
  Administered 2022-12-19: 75 mL via INTRAVENOUS

## 2022-12-19 MED ORDER — SODIUM CHLORIDE 0.9 % IV BOLUS
1000.0000 mL | Freq: Once | INTRAVENOUS | Status: AC
  Administered 2022-12-19: 1000 mL via INTRAVENOUS

## 2022-12-19 MED ORDER — PANTOPRAZOLE SODIUM 40 MG IV SOLR
40.0000 mg | Freq: Once | INTRAVENOUS | Status: AC
  Administered 2022-12-19: 40 mg via INTRAVENOUS
  Filled 2022-12-19: qty 10

## 2022-12-19 MED ORDER — ACETAMINOPHEN 325 MG PO TABS: 650.0000 mg | ORAL_TABLET | Freq: Four times a day (QID) | ORAL | Status: DC | PRN

## 2022-12-19 MED ORDER — PAROXETINE HCL 10 MG PO TABS
5.0000 mg | ORAL_TABLET | Freq: Every day | ORAL | Status: DC
  Administered 2022-12-20: 5 mg via ORAL
  Filled 2022-12-19: qty 0.5

## 2022-12-19 NOTE — Plan of Care (Signed)

## 2022-12-19 NOTE — H&P (Signed)
History and Physical    Molly Brady:[id number] DOB: 08-14-1960 DOA: 12/19/2022  DOS: the patient was seen and examined on 12/19/2022  PCP: Molly Mackintosh, Brady   Patient coming from: Home  I have personally briefly reviewed patient's old medical records in [facility]  Molly Brady, a 62 y/o followed by Molly Brady for primary care, Molly Brady for GI was seen in [facility] ED 12/10/22 for BRBPR. Evaluation c/w colitis by CT abd/pelvis and UTI. She was offered admission for further evaluation but declined. She was discharged on Augmentin for UTI and colitis. On 12/11/22 she was seen [facility] ED for same problem. Her symptoms were better, labs were stable and she was discharged with instructions to follow-up with Molly Brady January 05, 2023. Cipro was started. On Dec 17, 2022 she saw Molly Brady for follow-up. She had resolution of abdominal pain and symptoms. She finished augmentin and was to finish Cipro. Her symptoms of abdominal fullness and discomfort and BRBPR recurred and she presents to [facility] for evaluation.   ED Course: T 98.7  158/99  HR 73  RR 14  Per EDP - diffuse abdominal tenderness. Lab: Cr 1.05, CBCD nl, U/A Molly Brady office 12/17/22 negative. CTA - no point source of bleeding. Progressive diffuse bowel wall thickening noted throughout colon with fat stranding. Asked by ED P to admit patient pending colonoscopy per GI service.   Review of Systems:  Review of Systems  Constitutional:  Negative for chills, fever and weight loss.  HENT: Negative.    Respiratory: Negative.    Cardiovascular: Negative.   Gastrointestinal:  Positive for abdominal pain, blood in stool and nausea.  Genitourinary: Negative.   Musculoskeletal: Negative.   Skin: Negative.   Neurological:  Positive for headaches.       Had migraine today  Endo/Heme/Allergies: Negative.   Psychiatric/Behavioral: Negative.      Past Medical History:  Diagnosis Date   ADHD    Anemia    none recent   Anxiety and depression     Elevated liver enzymes 2019   resolved 2019 related to ibuprofen use   Endometrial polyp    bleeding/spotting x  2-3 months   Migraines     Past Surgical History:  Procedure Laterality Date   ABDOMINAL HYSTERECTOMY     DILATATION & CURETTAGE/HYSTEROSCOPY WITH MYOSURE N/A 02/27/2020   Procedure: DILATATION & CURETTAGE/HYSTEROSCOPY WITH MYOSURE;  Surgeon: Molly Brady;  Location: [facility] [place];  Service: Gynecology;  Laterality: N/A;   LAPAROSCOPIC VAGINAL HYSTERECTOMY WITH SALPINGO OOPHORECTOMY     UMBILICAL HERNIA REPAIR  as baby    Soc Hx -  married and divorced x 3. Has one daughter and several grandchildren. Self-employed as an Biomedical engineer. Lives alone.    reports that she has never smoked. She has never used smokeless tobacco. She reports that she does not currently use alcohol. She reports that she does not currently use drugs.  No Known Allergies  Family History  Problem Relation Age of Onset   Lung cancer Mother    Asthma Mother    Lung cancer Father    Asthma Sister    Breast cancer Maternal Aunt    Breast cancer Maternal Grandmother     Prior to Admission medications   Medication Sig Start Date End Date Taking? Authorizing Provider  ALPRAZolam Prudy Feeler) 0.5 MG tablet TAKE 1-2 TABLETS BY MOUTH AT BEDTIME AS NEEDED 11/06/22   Molly Brady  amoxicillin-clavulanate (AUGMENTIN) [phone]  MG tablet Take 1 tablet by mouth every 12 (twelve) hours. Patient not taking: Reported on 12/17/2022 12/10/22   Molly Brady  amphetamine-dextroamphetamine (ADDERALL) 30 MG tablet Take 1/2 tablet by mouth 2 (two) times daily. 11/16/22   Molly Brady  buPROPion (WELLBUTRIN XL) 150 MG 24 hr tablet TAKE 3 TABLETS BY MOUTH DAILY 12/11/22   Molly Brady  ciprofloxacin (CIPRO) 250 MG tablet Take 1 tablet (250 mg total) by mouth 2 (two) times daily. 12/13/22   Molly Mackintosh, Brady  gabapentin (NEURONTIN) 100 MG capsule TAKE 2 CAPSULES BY  MOUTH AT BEDTIME 11/03/22   Molly Brady  ibuprofen (ADVIL) 800 MG tablet Take 1 tablet (800 mg total) by mouth every 8 (eight) hours as needed. 02/27/20   Molly Brady  lisdexamfetamine (VYVANSE) 70 MG capsule Take 1 capsule (70 mg total) by mouth daily. 09/21/22   Molly Brady  PARoxetine (PAXIL) 10 MG tablet Take 5 mg by mouth daily. weaning    Provider, Historical, Brady  rizatriptan (MAXALT) 10 MG tablet TAKE 1 TABLET BY MOUTH AND MAY REPEAT IN 2 HOURS AS NEEDED AS DIRECTED 10/16/22   Molly Brady    Physical Exam: Vitals:   12/19/22 0936 12/19/22 1239 12/19/22 1419  BP: 132/72 (!) 158/99 114/65  Pulse: 98 73 80  Resp: 16 14 16   Temp: 98.7 F (37.1 C)    TempSrc: Oral    SpO2: 100% 98% 100%    Physical Exam Vitals and nursing note reviewed.  Constitutional:      General: She is not in acute distress.    Appearance: Normal appearance. She is normal weight.  HENT:     Head: Normocephalic and atraumatic.     Mouth/Throat:     Mouth: Mucous membranes are moist.  Eyes:     General: No scleral icterus.    Extraocular Movements: Extraocular movements intact.     Conjunctiva/sclera: Conjunctivae normal.     Pupils: Pupils are equal, round, and reactive to light.  Cardiovascular:     Rate and Rhythm: Normal rate and regular rhythm.     Pulses: Normal pulses.     Heart sounds: Normal heart sounds.  Pulmonary:     Effort: Pulmonary effort is normal.     Breath sounds: Normal breath sounds. No rhonchi or rales.  Abdominal:     General: Abdomen is flat. Bowel sounds are normal. There is no distension.     Palpations: Abdomen is soft.     Tenderness: There is abdominal tenderness. There is no right CVA tenderness, left CVA tenderness, guarding or rebound.     Comments: Nl BS. Tender to mild palpation, pain with deep palpation. No guarding, no rebound. No HSM  Musculoskeletal:        General: Normal range of motion.     Cervical back: Normal  range of motion and neck supple.  Skin:    General: Skin is warm and dry.  Neurological:     General: No focal deficit present.     Mental Status: She is alert and oriented to person, place, and time. Mental status is at baseline.     Cranial Nerves: No cranial nerve deficit.  Psychiatric:        Mood and Affect: Mood normal.        Behavior: Behavior normal.      Labs on Admission: I have personally reviewed following labs  and imaging studies  CBC: Recent Labs  Lab 12/19/22 0939  WBC 8.9  HGB 12.9  HCT 39.7  MCV 91.5  PLT 370   Basic Metabolic Panel: Recent Labs  Lab 12/19/22 0939  NA 137  K 4.0  CL 101  CO2 25  GLUCOSE 96  BUN 11  CREATININE 1.05*  CALCIUM 9.0   GFR: Estimated Creatinine Clearance: 49.8 mL/min (A) (by C-G formula based on SCr of 1.05 mg/dL (H)). Liver Function Tests: Recent Labs  Lab 12/19/22 0939  AST 24  ALT 22  ALKPHOS 91  BILITOT 0.1*  PROT 6.9  ALBUMIN 3.5   No results for input(s): "LIPASE", "AMYLASE" in the last 168 hours. No results for input(s): "AMMONIA" in the last 168 hours. Coagulation Profile: No results for input(s): "INR", "PROTIME" in the last 168 hours. Cardiac Enzymes: No results for input(s): "CKTOTAL", "CKMB", "CKMBINDEX", "TROPONINI" in the last 168 hours. BNP (last 3 results) No results for input(s): "PROBNP" in the last 8760 hours. HbA1C: No results for input(s): "HGBA1C" in the last 72 hours. CBG: No results for input(s): "GLUCAP" in the last 168 hours. Lipid Profile: No results for input(s): "CHOL", "HDL", "LDLCALC", "TRIG", "CHOLHDL", "LDLDIRECT" in the last 72 hours. Thyroid Function Tests: No results for input(s): "TSH", "T4TOTAL", "FREET4", "T3FREE", "THYROIDAB" in the last 72 hours. Anemia Panel: No results for input(s): "VITAMINB12", "FOLATE", "FERRITIN", "TIBC", "IRON", "RETICCTPCT" in the last 72 hours. Urine analysis:    Component Value Date/Time   COLORURINE COLORLESS (A) 12/10/2022 1349    APPEARANCEUR CLEAR 12/10/2022 1349   LABSPEC <1.005 (L) 12/10/2022 1349   PHURINE 7.0 12/10/2022 1349   GLUCOSEU NEGATIVE 12/10/2022 1349   HGBUR NEGATIVE 12/10/2022 1349   BILIRUBINUR neg 12/17/2022 1253   KETONESUR NEGATIVE 12/10/2022 1349   PROTEINUR Negative 12/17/2022 1253   PROTEINUR NEGATIVE 12/10/2022 1349   UROBILINOGEN 0.2 12/17/2022 1253   NITRITE neg 12/17/2022 1253   NITRITE NEGATIVE 12/10/2022 1349   LEUKOCYTESUR Negative 12/17/2022 1253   LEUKOCYTESUR MODERATE (A) 12/10/2022 1349    Radiological Exams on Admission: I have personally reviewed images CT ANGIO GI BLEED  Result Date: 12/19/2022 CLINICAL DATA:  Lower GI bleed. EXAM: CTA ABDOMEN AND PELVIS WITHOUT AND WITH CONTRAST TECHNIQUE: Multidetector CT imaging of the abdomen and pelvis was performed using the standard protocol during bolus administration of intravenous contrast. Multiplanar reconstructed images and MIPs were obtained and reviewed to evaluate the vascular anatomy. RADIATION DOSE REDUCTION: This exam was performed according to the departmental dose-optimization program which includes automated exposure control, adjustment of the mA and/or kV according to patient size and/or use of iterative reconstruction technique. CONTRAST:  75mL OMNIPAQUE IOHEXOL 350 MG/ML SOLN COMPARISON:  CT a abdomen and pelvis 12/10/2022 FINDINGS: VASCULAR Aorta: Normal caliber aorta without aneurysm, dissection, vasculitis or significant stenosis. Celiac: Patent without evidence of aneurysm, dissection, vasculitis or significant stenosis. SMA: Patent without evidence of aneurysm, dissection, vasculitis or significant stenosis. Renals: Both renal arteries are patent without evidence of aneurysm, dissection, vasculitis, fibromuscular dysplasia or significant stenosis. IMA: Patent without evidence of aneurysm, dissection, vasculitis or significant stenosis. Inflow: Patent without evidence of aneurysm, dissection, vasculitis or significant  stenosis. Proximal Outflow: Bilateral common femoral and visualized portions of the superficial and profunda femoral arteries are patent without evidence of aneurysm, dissection, vasculitis or significant stenosis. Veins: No obvious venous abnormality. Review of the MIP images confirms the above findings. NON-VASCULAR Lower chest: The lung bases are clear without focal nodule, mass, or airspace disease. Heart size is  normal. No significant pleural or pericardial effusion is present. Hepatobiliary: No focal liver abnormality is seen. No gallstones, gallbladder wall thickening, or biliary dilatation. Pancreas: Unremarkable. No pancreatic ductal dilatation or surrounding inflammatory changes. Spleen: Normal in size without focal abnormality. Adrenals/Urinary Tract: Adrenal glands are unremarkable. Kidneys are normal, without renal calculi, focal lesion, or hydronephrosis. Bladder is unremarkable. Stomach/Bowel: Stomach and duodenum are within. Progressive diffuse wall thickening is present now throughout nearly the entire colon. The proximal colon and cecum are spared. There is some adjacent fat stranding. No free air free fluid is present. Intraluminal contrast is present on arterial or delayed venous imaging. Lymphatic: Significant abdominal or pelvic adenopathy present. Reproductive: Status post hysterectomy. No adnexal masses. Other: No abdominal wall hernia or abnormality. No abdominopelvic ascites. Musculoskeletal: The vertebral body heights and alignment are normal. Mild rightward curvature is present. No focal osseous lesions are present. The bony pelvis is normal. Hips are located and within normal limits. IMPRESSION: 1. Progressive diffuse wall thickening now throughout nearly the entire colon with some adjacent fat stranding. Findings are consistent with an infectious or inflammatory colitis. This is likely the source lower GI bleed. 2. No evidence for active gastrointestinal bleeding. 3. Normal and  unchanged appearance of the abdominal aorta and branch vessels as described. Electronically Signed   By: Marin Roberts M.D.   On: 12/19/2022 13:12   DG Chest 2 View  Result Date: 12/19/2022 CLINICAL DATA:  Shortness of breath EXAM: CHEST - 2 VIEW COMPARISON:  06/24/2018 FINDINGS: The heart size and mediastinal contours are within normal limits. Both lungs are clear. The visualized skeletal structures are unremarkable. IMPRESSION: No active cardiopulmonary disease. Electronically Signed   By: Duanne Guess D.O.   On: 12/19/2022 11:20    EKG: I have personally reviewed EKG: NSR, RAE, no acute changes  Assessment/Plan Principal Problem:   Colitis Active Problems:   Anxiety and depression    Assessment and Plan: * Colitis Patient with CT abd/pelvis 12/10/22 c/w colitis. Patients symptoms waned and now waxed. CTA GI bleed protocol reveals diffuse colonic wall thickening and fat stranding worse than prior study. Dr. Elnoria Howard consulted and recommends admission, hydration and bowel prep for anticipated colonoscopy for diagnostic purposes.  \ Plan Med surg inpatient admit  Full liquid diet  Await GI recommendations in regard to timing of colonoscopy  No indication for continue Abx: finish augmentin, almost finished Cipro. CBCDnl, U/A neg at PCP office.   Anxiety and depression Patient on multiple medications for her symptoms - generally well controlled.  Plan Continue home regimen       DVT prophylaxis: SQ Heparin Code Status: Full Code Family Communication: daughter present during interview  Disposition Plan: home when medically stably  Consults called: GI - Dr. Marina Goodell - covering for Molly Brady  Admission status: Inpatient, Med-Surg   Illene Regulus, Brady [facility] 12/19/2022, 3:16 PM

## 2022-12-19 NOTE — Patient Instructions (Signed)
Advance diet very slowly. UTI has resolved. Please follow up with Dr. Loreta Ave as planned.

## 2022-12-19 NOTE — Consult Note (Addendum)
Referring Provider: EDP Primary Care Physician:  Margaree Mackintosh, MD Primary Gastroenterologist:  Dr. Loreta Ave, [facility] cross-covering for the weekend  Reason for Consultation:  Bloody diarrhea, colitis on CT scan  HPI: Molly Brady is a 62 y.o. female who has past medical history of endometrial cancer diagnosed 3 years ago, not currently on any treatment.  She presented to [facility] ED on 5/9 with complaints of sudden onset bloody diarrhea and abdominal pain that began 1 week prior.  CT scan showed colitis and she was found to have UTI.  She was offered admission but declined.  She said she wanted to go to [facility] to be evaluated instead.  Looks like she left AMA and she was given Augmentin for the UTI and colitis.  She was seen at [facility] the next day for the same problem where they only gave her IV fluids and discharged her home to follow-up with GI. She saw her PCP at some point as well a few days later and they put her on Cipro.  Her symptoms have been persistent despite the antibiotics and now she presents back here again today with the same complaints.  Once again, the symptoms were sudden in onset about 2 weeks ago.  Prior to that she was feeling just fine with normal bowel movements, no abdominal pain, etc.  She denies any nausea or vomiting.  No regular NSAID use.  No blood thinners at home.  None of her medications are new/different/changed other than the 2 recent antibiotics.  No other antibiotics within the past several months.  No sick contacts or recent travel.  CBC is normal.  CMP is normal except for just a slight bump in her creatinine.  CT angio abdomen and pelvis:  IMPRESSION: 1. Progressive diffuse wall thickening now throughout nearly the entire colon with some adjacent fat stranding. Findings are consistent with an infectious or inflammatory colitis. This is likely the source lower GI bleed. 2. No evidence for active gastrointestinal bleeding. 3. Normal and unchanged  appearance of the abdominal aorta and branch vessels as described.  This appears worse from CT angio 9 days ago that showed mild wall thickening inflammatory fat stranding about the decompressed descending and sigmoid colon.  Colonoscopy August 2019 with Dr. Loreta Ave showed 1 sessile polyp that was removed from the cecum.  There was a tubular adenoma.   Past Medical History:  Diagnosis Date   ADHD    Anemia    none recent   Anxiety and depression    Elevated liver enzymes 2019   resolved 2019 related to ibuprofen use   Endometrial polyp    bleeding/spotting x  2-3 months   Migraines     Past Surgical History:  Procedure Laterality Date   ABDOMINAL HYSTERECTOMY     DILATATION & CURETTAGE/HYSTEROSCOPY WITH MYOSURE N/A 02/27/2020   Procedure: DILATATION & CURETTAGE/HYSTEROSCOPY WITH MYOSURE;  Surgeon: Vick Frees, MD;  Location: [facility];  Service: Gynecology;  Laterality: N/A;   LAPAROSCOPIC VAGINAL HYSTERECTOMY WITH SALPINGO OOPHORECTOMY     UMBILICAL HERNIA REPAIR  as baby    Prior to Admission medications   Medication Sig Start Date End Date Taking? Authorizing Provider  ALPRAZolam Prudy Feeler) 0.5 MG tablet TAKE 1-2 TABLETS BY MOUTH AT BEDTIME AS NEEDED 11/06/22   Cottle, Steva Ready., MD  amoxicillin-clavulanate (AUGMENTIN) 875-125 MG tablet Take 1 tablet by mouth every 12 (twelve) hours. Patient not taking: Reported on 12/17/2022 12/10/22   Henderly, Britni A, PA-C  amphetamine-dextroamphetamine (ADDERALL) 30 MG tablet Take 1/2 tablet by mouth 2 (two) times daily. 11/16/22   Lauraine Rinne., MD  buPROPion (WELLBUTRIN XL) 150 MG 24 hr tablet TAKE 3 TABLETS BY MOUTH DAILY 12/11/22   Cottle, Steva Ready., MD  ciprofloxacin (CIPRO) 250 MG tablet Take 1 tablet (250 mg total) by mouth 2 (two) times daily. 12/13/22   Margaree Mackintosh, MD  gabapentin (NEURONTIN) 100 MG capsule TAKE 2 CAPSULES BY MOUTH AT BEDTIME 11/03/22   Cottle, Steva Ready., MD  ibuprofen (ADVIL) 800 MG  tablet Take 1 tablet (800 mg total) by mouth every 8 (eight) hours as needed. 02/27/20   Vick Frees, MD  lisdexamfetamine (VYVANSE) 70 MG capsule Take 1 capsule (70 mg total) by mouth daily. 09/21/22   Cottle, Steva Ready., MD  PARoxetine (PAXIL) 10 MG tablet Take 5 mg by mouth daily. weaning    [provider]  rizatriptan (MAXALT) 10 MG tablet TAKE 1 TABLET BY MOUTH AND MAY REPEAT IN 2 HOURS AS NEEDED AS DIRECTED 10/16/22   Cottle, Steva Ready., MD    Current Facility-Administered Medications  Medication Dose Route Frequency Provider Last Rate Last Admin   sodium chloride 0.9 % bolus 1,000 mL  1,000 mL Intravenous Once Small, Brooke L, PA       Current Outpatient Medications  Medication Sig Dispense Refill   ALPRAZolam (XANAX) 0.5 MG tablet TAKE 1-2 TABLETS BY MOUTH AT BEDTIME AS NEEDED 60 tablet 2   amoxicillin-clavulanate (AUGMENTIN) 875-125 MG tablet Take 1 tablet by mouth every 12 (twelve) hours. (Patient not taking: Reported on 12/17/2022) 14 tablet 0   amphetamine-dextroamphetamine (ADDERALL) 30 MG tablet Take 1/2 tablet by mouth 2 (two) times daily. 30 tablet 0   buPROPion (WELLBUTRIN XL) 150 MG 24 hr tablet TAKE 3 TABLETS BY MOUTH DAILY 270 tablet 0   ciprofloxacin (CIPRO) 250 MG tablet Take 1 tablet (250 mg total) by mouth 2 (two) times daily. 14 tablet 0   gabapentin (NEURONTIN) 100 MG capsule TAKE 2 CAPSULES BY MOUTH AT BEDTIME 180 capsule 0   ibuprofen (ADVIL) 800 MG tablet Take 1 tablet (800 mg total) by mouth every 8 (eight) hours as needed. 30 tablet 0   lisdexamfetamine (VYVANSE) 70 MG capsule Take 1 capsule (70 mg total) by mouth daily. 30 capsule 0   PARoxetine (PAXIL) 10 MG tablet Take 5 mg by mouth daily. weaning     rizatriptan (MAXALT) 10 MG tablet TAKE 1 TABLET BY MOUTH AND MAY REPEAT IN 2 HOURS AS NEEDED AS DIRECTED 12 tablet 3    Allergies as of 12/19/2022   (No Known Allergies)    Family History  Problem Relation Age of Onset   Lung cancer Mother     Asthma Mother    Lung cancer Father    Asthma Sister    Breast cancer Maternal Aunt    Breast cancer Maternal Grandmother     Social History   Socioeconomic History   Marital status: Divorced    Spouse name: Not on file   Number of children: Not on file   Years of education: Not on file   Highest education level: Not on file  Occupational History   Not on file  Tobacco Use   Smoking status: Never   Smokeless tobacco: Never  Vaping Use   Vaping Use: Never used  Substance and Sexual Activity   Alcohol use: Not Currently   Drug use: Not Currently   Sexual activity:  Not on file  Other Topics Concern   Not on file  Social History Narrative   Not on file   Social Determinants of Health   Financial Resource Strain: Not on file  Food Insecurity: Not on file  Transportation Needs: Not on file  Physical Activity: Not on file  Stress: Not on file  Social Connections: Not on file  Intimate Partner Violence: Not on file   Review of Systems: ROS is O/W negative except as mentioned in HPI.  Physical Exam: Vital signs in last 24 hours: Temp:  [98.7 F (37.1 C)] 98.7 F (37.1 C) (05/18 0936) Pulse Rate:  [73-98] 73 (05/18 1239) Resp:  [14-16] 14 (05/18 1239) BP: (132-158)/(72-99) 158/99 (05/18 1239) SpO2:  [98 %-100 %] 98 % (05/18 1239)   General:  Alert, Well-developed, well-nourished, pleasant and cooperative in NAD Head:  Normocephalic and atraumatic. Eyes:  Sclera clear, no icterus.  Conjunctiva pink. Ears:  Normal auditory acuity. Mouth:  No deformity or lesions.   Lungs:  Clear throughout to auscultation.  No wheezes, crackles, or rhonchi.  Heart:  Regular rate and rhythm; no murmurs, clicks, rubs, or gallops. Abdomen:  Soft, non-distended.  BS present and somewhat hyperactive.  Mild diffuse TTP.   Rectal:  Deferred  Msk:  Symmetrical without gross deformities. Pulses:  Normal pulses noted. Extremities:  Without clubbing or edema. Neurologic:  Alert and  oriented x 4;  grossly normal neurologically. Skin:  Intact without significant lesions or rashes. Psych:  Alert and cooperative. Normal mood and affect.  Lab Results: Recent Labs    12/19/22 0939  WBC 8.9  HGB 12.9  HCT 39.7  PLT 370   BMET Recent Labs    12/19/22 0939  NA 137  K 4.0  CL 101  CO2 25  GLUCOSE 96  BUN 11  CREATININE 1.05*  CALCIUM 9.0   LFT Recent Labs    12/19/22 0939  PROT 6.9  ALBUMIN 3.5  AST 24  ALT 22  ALKPHOS 91  BILITOT 0.1*   Studies/Results: CT ANGIO GI BLEED  Result Date: 12/19/2022 CLINICAL DATA:  Lower GI bleed. EXAM: CTA ABDOMEN AND PELVIS WITHOUT AND WITH CONTRAST TECHNIQUE: Multidetector CT imaging of the abdomen and pelvis was performed using the standard protocol during bolus administration of intravenous contrast. Multiplanar reconstructed images and MIPs were obtained and reviewed to evaluate the vascular anatomy. RADIATION DOSE REDUCTION: This exam was performed according to the departmental dose-optimization program which includes automated exposure control, adjustment of the mA and/or kV according to patient size and/or use of iterative reconstruction technique. CONTRAST:  75mL OMNIPAQUE IOHEXOL 350 MG/ML SOLN COMPARISON:  CT a abdomen and pelvis 12/10/2022 FINDINGS: VASCULAR Aorta: Normal caliber aorta without aneurysm, dissection, vasculitis or significant stenosis. Celiac: Patent without evidence of aneurysm, dissection, vasculitis or significant stenosis. SMA: Patent without evidence of aneurysm, dissection, vasculitis or significant stenosis. Renals: Both renal arteries are patent without evidence of aneurysm, dissection, vasculitis, fibromuscular dysplasia or significant stenosis. IMA: Patent without evidence of aneurysm, dissection, vasculitis or significant stenosis. Inflow: Patent without evidence of aneurysm, dissection, vasculitis or significant stenosis. Proximal Outflow: Bilateral common femoral and visualized portions of the  superficial and profunda femoral arteries are patent without evidence of aneurysm, dissection, vasculitis or significant stenosis. Veins: No obvious venous abnormality. Review of the MIP images confirms the above findings. NON-VASCULAR Lower chest: The lung bases are clear without focal nodule, mass, or airspace disease. Heart size is normal. No significant pleural or pericardial effusion is present.  Hepatobiliary: No focal liver abnormality is seen. No gallstones, gallbladder wall thickening, or biliary dilatation. Pancreas: Unremarkable. No pancreatic ductal dilatation or surrounding inflammatory changes. Spleen: Normal in size without focal abnormality. Adrenals/Urinary Tract: Adrenal glands are unremarkable. Kidneys are normal, without renal calculi, focal lesion, or hydronephrosis. Bladder is unremarkable. Stomach/Bowel: Stomach and duodenum are within. Progressive diffuse wall thickening is present now throughout nearly the entire colon. The proximal colon and cecum are spared. There is some adjacent fat stranding. No free air free fluid is present. Intraluminal contrast is present on arterial or delayed venous imaging. Lymphatic: Significant abdominal or pelvic adenopathy present. Reproductive: Status post hysterectomy. No adnexal masses. Other: No abdominal wall hernia or abnormality. No abdominopelvic ascites. Musculoskeletal: The vertebral body heights and alignment are normal. Mild rightward curvature is present. No focal osseous lesions are present. The bony pelvis is normal. Hips are located and within normal limits. IMPRESSION: 1. Progressive diffuse wall thickening now throughout nearly the entire colon with some adjacent fat stranding. Findings are consistent with an infectious or inflammatory colitis. This is likely the source lower GI bleed. 2. No evidence for active gastrointestinal bleeding. 3. Normal and unchanged appearance of the abdominal aorta and branch vessels as described. Electronically  Signed   By: Marin Roberts M.D.   On: 12/19/2022 13:12   DG Chest 2 View  Result Date: 12/19/2022 CLINICAL DATA:  Shortness of breath EXAM: CHEST - 2 VIEW COMPARISON:  06/24/2018 FINDINGS: The heart size and mediastinal contours are within normal limits. Both lungs are clear. The visualized skeletal structures are unremarkable. IMPRESSION: No active cardiopulmonary disease. Electronically Signed   By: Duanne Guess D.O.   On: 12/19/2022 11:20    IMPRESSION:  *62 year old female with about 2 weeks of bloody diarrhea and abdominal pain that was sudden in onset.  CT angio 9 days ago showed colitis in the descending and sigmoid colon.  Took course of Augmentin and Cipro recently without improvement.  Symptoms continued/worsened.  CT scan now looks worse with progressive diffuse wall thickening throughout the entire colon.  White blood cell count and hemoglobin are normal.  Electrolytes normal.  Stool studies have been ordered.  PLAN: -Follow-up stool studies, C. difficile and GI pathogen panel. -Monitor labs, cell counts, electrolytes, and renal function. -?  empiric IV antibiotics. -May need colonoscopy if stool studies negative. ? Timing per Dr. Marina Goodell.  Princella Pellegrini. Zehr  12/19/2022, 2:08 PM  GI ATTENDING  History, laboratories, x-rays reviewed.  Agree with comprehensive consultation note as outlined above.  Patient of Dr. Loreta Ave who has had a 3-week history of diarrhea.  Concern at this point would be inflammatory bowel disease.  Not appear toxic.  Stool studies are being obtained to rule out infectious bacterial colitis.  If these are negative, she will need colonoscopy with biopsies.  Will follow  Wilhemina Bonito. Eda Keys., M.D. [facility] Division of Gastroenterology

## 2022-12-19 NOTE — Subjective & Objective (Signed)
Molly Brady, a 62 y/o followed by Dr. Lenord Fellers for primary care, Dr. Loreta Ave for GI was seen in [facility] ED 12/10/22 for BRBPR. Evaluation c/w colitis by CT abd/pelvis and UTI. She was offered admission for further evaluation but declined. She was discharged on Augmentin for UTI and colitis. On 12/11/22 she was seen [facility] ED for same problem. Her symptoms were better, labs were stable and she was discharged with instructions to follow-up with Dr. Loreta Ave January 05, 2023. Cipro was started. On Dec 17, 2022 she saw Dr. Lenord Fellers for follow-up. She had resolution of abdominal pain and symptoms. She finished augmentin and was to finish Cipro. Her symptoms of abdominal fullness and discomfort and BRBPR recurred and she presents to [facility] for evaluation.

## 2022-12-19 NOTE — Assessment & Plan Note (Addendum)
Patient with CT abd/pelvis 12/10/22 c/w colitis. Patients symptoms waned and now waxed. CTA GI bleed protocol reveals diffuse colonic wall thickening and fat stranding worse than prior study. Dr. Elnoria Howard consulted and recommends admission, hydration and bowel prep for anticipated colonoscopy for diagnostic purposes.  \ Plan Med surg inpatient admit  Full liquid diet  Await GI recommendations in regard to timing of colonoscopy  No indication for continue Abx: finish augmentin, almost finished Cipro. CBCDnl, U/A neg at PCP office.

## 2022-12-19 NOTE — ED Notes (Signed)
ED TO INPATIENT HANDOFF REPORT  ED Nurse Name and Phone #: Victorino Dike [phone]  S Name/Age/Gender Molly Brady 62 y.o. female Room/Bed: 035C/035C  Code Status   Code Status: Full Code  Home/SNF/Other Home Patient oriented to: self, place, time, and situation Is this baseline? Yes   Triage Complete: Triage complete  Chief Complaint Colitis [K52.9]  Triage Note Pt c/o lower abdominal pain and bright red blood in stoolx2wks. Pt c/o weakness and dizziness. Pt denies blood thinners.   Allergies No Known Allergies  Level of Care/Admitting Diagnosis ED Disposition     ED Disposition  Admit   Condition  --   Comment  Hospital Area: [facility] [[id number]]  Level of Care: Med-Surg [[id number]]  May admit patient to [facility] or [facility] if equivalent level of care is available:: No  Covid Evaluation: Asymptomatic - no recent exposure (last 10 days) testing not required  Diagnosis: Colitis [454098]  Admitting Physician: Jacques Navy [[id number]]  Attending Physician: Jacques Navy [[id number]]  Certification:: I certify this patient will need inpatient services for at least 2 midnights  Estimated Length of Stay: 3          B Medical/Surgery History Past Medical History:  Diagnosis Date   ADHD    Anemia    none recent   Anxiety and depression    Elevated liver enzymes 2019   resolved 2019 related to ibuprofen use   Endometrial polyp    bleeding/spotting x  2-3 months   Migraines    Past Surgical History:  Procedure Laterality Date   ABDOMINAL HYSTERECTOMY     DILATATION & CURETTAGE/HYSTEROSCOPY WITH MYOSURE N/A 02/27/2020   Procedure: DILATATION & CURETTAGE/HYSTEROSCOPY WITH MYOSURE;  Surgeon: Vick Frees, MD;  Location: [facility] [place];  Service: Gynecology;  Laterality: N/A;   LAPAROSCOPIC VAGINAL HYSTERECTOMY WITH SALPINGO OOPHORECTOMY     UMBILICAL HERNIA REPAIR  as baby     A IV Location/Drains/Wounds Patient  Lines/Drains/Airways Status     Active Line/Drains/Airways     Name Placement date Placement time Site Days   Peripheral IV 12/19/22 22 G Anterior;Right Forearm 12/19/22  0959  Forearm  less than 1   Peripheral IV 12/19/22 20 G Left Antecubital 12/19/22  1030  Antecubital  less than 1            Intake/Output Last 24 hours No intake or output data in the 24 hours ending 12/19/22 1520  Labs/Imaging Results for orders placed or performed during the hospital encounter of 12/19/22 (from the past 48 hour(s))  Comprehensive metabolic panel     Status: Abnormal   Collection Time: 12/19/22  9:39 AM  Result Value Ref Range   Sodium 137 135 - 145 mmol/L   Potassium 4.0 3.5 - 5.1 mmol/L   Chloride 101 98 - 111 mmol/L   CO2 25 22 - 32 mmol/L   Glucose, Bld 96 70 - 99 mg/dL    Comment: Glucose reference range applies only to samples taken after fasting for at least 8 hours.   BUN 11 6 - 20 mg/dL   Creatinine, Ser 1.19 (H) 0.44 - 1.00 mg/dL   Calcium 9.0 8.9 - 14.7 mg/dL   Total Protein 6.9 6.5 - 8.1 g/dL   Albumin 3.5 3.5 - 5.0 g/dL   AST 24 15 - 41 U/L   ALT 22 0 - 44 U/L   Alkaline Phosphatase 91 38 - 126 U/L   Total Bilirubin 0.1 (L) 0.3 -  1.2 mg/dL   GFR, Estimated >16 >10 mL/min    Comment: (NOTE) Calculated using the CKD-EPI Creatinine Equation (2021)    Anion gap 11 5 - 15    Comment: Performed at [facility], [address], [place], [place] [address]  CBC     Status: None   Collection Time: 12/19/22  9:39 AM  Result Value Ref Range   WBC 8.9 4.0 - 10.5 K/uL   RBC 4.34 3.87 - 5.11 MIL/uL   Hemoglobin 12.9 12.0 - 15.0 g/dL   HCT 40.9 81.1 - 91.4 %   MCV 91.5 80.0 - 100.0 fL   MCH 29.7 26.0 - 34.0 pg   MCHC 32.5 30.0 - 36.0 g/dL   RDW 78.2 95.6 - 21.3 %   Platelets 370 150 - 400 K/uL   nRBC 0.0 0.0 - 0.2 %    Comment: Performed at [facility], [address], [place], [place] [address]  Type and screen [facility]     Status: None    Collection Time: 12/19/22  9:59 AM  Result Value Ref Range   ABO/RH(D) A POS    Antibody Screen NEG    Sample Expiration      12/22/2022,2359 Performed at [facility], [address], [place], [place] [address]   POC occult blood, ED     Status: Abnormal   Collection Time: 12/19/22 10:40 AM  Result Value Ref Range   Fecal Occult Bld POSITIVE (A) NEGATIVE  Troponin I (High Sensitivity)     Status: None   Collection Time: 12/19/22 11:46 AM  Result Value Ref Range   Troponin I (High Sensitivity) 4 <18 ng/L    Comment: (NOTE) Elevated high sensitivity troponin I (hsTnI) values and significant  changes across serial measurements may suggest ACS but many other  chronic and acute conditions are known to elevate hsTnI results.  Refer to the "Links" section for chest pain algorithms and additional  guidance. Performed at [facility], [address] [place], [place] [address]    CT ANGIO GI BLEED  Result Date: 12/19/2022 CLINICAL DATA:  Lower GI bleed. EXAM: CTA ABDOMEN AND PELVIS WITHOUT AND WITH CONTRAST TECHNIQUE: Multidetector CT imaging of the abdomen and pelvis was performed using the standard protocol during bolus administration of intravenous contrast. Multiplanar reconstructed images and MIPs were obtained and reviewed to evaluate the vascular anatomy. RADIATION DOSE REDUCTION: This exam was performed according to the departmental dose-optimization program which includes automated exposure control, adjustment of the mA and/or kV according to patient size and/or use of iterative reconstruction technique. CONTRAST:  75mL OMNIPAQUE IOHEXOL 350 MG/ML SOLN COMPARISON:  CT a abdomen and pelvis 12/10/2022 FINDINGS: VASCULAR Aorta: Normal caliber aorta without aneurysm, dissection, vasculitis or significant stenosis. Celiac: Patent without evidence of aneurysm, dissection, vasculitis or significant stenosis. SMA: Patent without evidence of aneurysm, dissection, vasculitis or  significant stenosis. Renals: Both renal arteries are patent without evidence of aneurysm, dissection, vasculitis, fibromuscular dysplasia or significant stenosis. IMA: Patent without evidence of aneurysm, dissection, vasculitis or significant stenosis. Inflow: Patent without evidence of aneurysm, dissection, vasculitis or significant stenosis. Proximal Outflow: Bilateral common femoral and visualized portions of the superficial and profunda femoral arteries are patent without evidence of aneurysm, dissection, vasculitis or significant stenosis. Veins: No obvious venous abnormality. Review of the MIP images confirms the above findings. NON-VASCULAR Lower chest: The lung bases are clear without focal nodule, mass, or airspace disease. Heart size is normal.  No significant pleural or pericardial effusion is present. Hepatobiliary: No focal liver abnormality is seen. No gallstones, gallbladder wall thickening, or biliary dilatation. Pancreas: Unremarkable. No pancreatic ductal dilatation or surrounding inflammatory changes. Spleen: Normal in size without focal abnormality. Adrenals/Urinary Tract: Adrenal glands are unremarkable. Kidneys are normal, without renal calculi, focal lesion, or hydronephrosis. Bladder is unremarkable. Stomach/Bowel: Stomach and duodenum are within. Progressive diffuse wall thickening is present now throughout nearly the entire colon. The proximal colon and cecum are spared. There is some adjacent fat stranding. No free air free fluid is present. Intraluminal contrast is present on arterial or delayed venous imaging. Lymphatic: Significant abdominal or pelvic adenopathy present. Reproductive: Status post hysterectomy. No adnexal masses. Other: No abdominal wall hernia or abnormality. No abdominopelvic ascites. Musculoskeletal: The vertebral body heights and alignment are normal. Mild rightward curvature is present. No focal osseous lesions are present. The bony pelvis is normal. Hips are  located and within normal limits. IMPRESSION: 1. Progressive diffuse wall thickening now throughout nearly the entire colon with some adjacent fat stranding. Findings are consistent with an infectious or inflammatory colitis. This is likely the source lower GI bleed. 2. No evidence for active gastrointestinal bleeding. 3. Normal and unchanged appearance of the abdominal aorta and branch vessels as described. Electronically Signed   By: Marin Roberts M.D.   On: 12/19/2022 13:12   DG Chest 2 View  Result Date: 12/19/2022 CLINICAL DATA:  Shortness of breath EXAM: CHEST - 2 VIEW COMPARISON:  06/24/2018 FINDINGS: The heart size and mediastinal contours are within normal limits. Both lungs are clear. The visualized skeletal structures are unremarkable. IMPRESSION: No active cardiopulmonary disease. Electronically Signed   By: Duanne Guess D.O.   On: 12/19/2022 11:20    Pending Labs Unresulted Labs (From admission, onward)     Start     Ordered   12/20/22 0500  Hemoglobin and hematocrit, blood  Tomorrow morning,   R        12/19/22 1506   12/19/22 1359  Gastrointestinal Panel by PCR , Stool  (Gastrointestinal Panel by PCR, Stool                                                                                                                                                     **Does Not include CLOSTRIDIUM DIFFICILE testing. **If CDIFF testing is needed, place order from the "C Difficile Testing" order set.**)  Once,   URGENT        12/19/22 1359   12/19/22 1020  C Difficile Quick Screen w PCR reflex  (C Difficile quick screen w PCR reflex panel )  Once, for 24 hours,   URGENT       References:    CDiff Information Tool   12/19/22 1019   12/19/22 1018  Urinalysis, Routine w reflex microscopic -Urine, Clean Catch  Once,   URGENT       Question:  Specimen Source  Answer:  Urine, Clean Catch   12/19/22 1017            Vitals/Pain Today's Vitals   12/19/22 0934 12/19/22 0936 12/19/22 1239  12/19/22 1419  BP:  132/72 (!) 158/99 114/65  Pulse:  98 73 80  Resp:  16 14 16   Temp:  98.7 F (37.1 C)    TempSrc:  Oral    SpO2:  100% 98% 100%  PainSc: 8        Isolation Precautions Enteric precautions (UV disinfection)  Medications Medications  SUMAtriptan (IMITREX) tablet 100 mg (has no administration in time range)  ALPRAZolam (XANAX) tablet 0.5 mg (has no administration in time range)  amphetamine-dextroamphetamine (ADDERALL) tablet 0.5 tablet (has no administration in time range)  buPROPion (WELLBUTRIN XL) 24 hr tablet 450 mg (has no administration in time range)  lisdexamfetamine (VYVANSE) capsule 70 mg (has no administration in time range)  PARoxetine (PAXIL) tablet 5 mg (has no administration in time range)  gabapentin (NEURONTIN) capsule 200 mg (has no administration in time range)  heparin injection 5,000 Units (has no administration in time range)  0.45 % sodium chloride infusion (has no administration in time range)  acetaminophen (TYLENOL) tablet 650 mg (has no administration in time range)    Or  acetaminophen (TYLENOL) suppository 650 mg (has no administration in time range)  ketorolac (TORADOL) 30 MG/ML injection 30 mg (has no administration in time range)  pantoprazole (PROTONIX) injection 40 mg (40 mg Intravenous Given 12/19/22 1033)  iohexol (OMNIPAQUE) 350 MG/ML injection 75 mL (75 mLs Intravenous Contrast Given 12/19/22 1237)  sodium chloride 0.9 % bolus 1,000 mL (1,000 mLs Intravenous New Bag/Given 12/19/22 1419)    Mobility walks     Focused Assessments Cardiac Assessment Handoff:    No results found for: "CKTOTAL", "CKMB", "CKMBINDEX", "TROPONINI" No results found for: "DDIMER" Does the Patient currently have chest pain? No    R Recommendations: See Admitting Provider Note  Report given to:   Additional Notes:

## 2022-12-19 NOTE — ED Triage Notes (Signed)
Pt c/o lower abdominal pain and bright red blood in stoolx2wks. Pt c/o weakness and dizziness. Pt denies blood thinners.

## 2022-12-19 NOTE — ED Provider Notes (Signed)
[place] EMERGENCY DEPARTMENT AT [facility] Provider Note   CSN: [id number] Arrival date & time: 12/19/22  4098     History  Chief Complaint  Patient presents with   GI Bleeding    Molly Brady is a 62 y.o. female, hx of endometrial cancer s/p hysterectomy, who presents to the ED 2/2 to blood in stoolx2 weeks. Notes she was seen in ED 2 weeks ago and recommended admission but she decided to leave and go to [facility] as her oncology team is established there and they did not choose to admit her. Has had persistent stools--7-8/day w/bright red or maroon like blood in them. Endorses worsening fatigue, weakness, SOB on exertion, and dizziness when standing. Was treated for a UTI 7 days ago w/augmentin, but prior to that had not had any antibiotics or traveled outside the country. Is not on any blood thinners.      Home Medications Prior to Admission medications   Medication Sig Start Date End Date Taking? Authorizing Provider  ALPRAZolam Prudy Feeler) 0.5 MG tablet TAKE 1-2 TABLETS BY MOUTH AT BEDTIME AS NEEDED 11/06/22   Cottle, Steva Ready., MD  amoxicillin-clavulanate (AUGMENTIN) 875-125 MG tablet Take 1 tablet by mouth every 12 (twelve) hours. Patient not taking: Reported on 12/17/2022 12/10/22   Henderly, Britni A, PA-C  amphetamine-dextroamphetamine (ADDERALL) 30 MG tablet Take 1/2 tablet by mouth 2 (two) times daily. 11/16/22   Lauraine Rinne., MD  buPROPion (WELLBUTRIN XL) 150 MG 24 hr tablet TAKE 3 TABLETS BY MOUTH DAILY 12/11/22   Cottle, Steva Ready., MD  ciprofloxacin (CIPRO) 250 MG tablet Take 1 tablet (250 mg total) by mouth 2 (two) times daily. 12/13/22   Margaree Mackintosh, MD  gabapentin (NEURONTIN) 100 MG capsule TAKE 2 CAPSULES BY MOUTH AT BEDTIME 11/03/22   Cottle, Steva Ready., MD  ibuprofen (ADVIL) 800 MG tablet Take 1 tablet (800 mg total) by mouth every 8 (eight) hours as needed. 02/27/20   Vick Frees, MD  lisdexamfetamine (VYVANSE) 70 MG capsule Take 1 capsule (70 mg  total) by mouth daily. 09/21/22   Cottle, Steva Ready., MD  PARoxetine (PAXIL) 10 MG tablet Take 5 mg by mouth daily. weaning    [provider]  rizatriptan (MAXALT) 10 MG tablet TAKE 1 TABLET BY MOUTH AND MAY REPEAT IN 2 HOURS AS NEEDED AS DIRECTED 10/16/22   Cottle, Steva Ready., MD      Allergies    Patient has no known allergies.    Review of Systems   Review of Systems  Constitutional:  Positive for fatigue. Negative for fever.  Respiratory:  Positive for shortness of breath.     Physical Exam Updated Vital Signs BP 114/65   Pulse 80   Temp 98.7 F (37.1 C) (Oral)   Resp 16   LMP 08/03/2014   SpO2 100%  Physical Exam Vitals and nursing note reviewed.  Constitutional:      General: She is not in acute distress.    Appearance: She is well-developed.  HENT:     Head: Normocephalic and atraumatic.  Eyes:     Conjunctiva/sclera: Conjunctivae normal.  Cardiovascular:     Rate and Rhythm: Normal rate and regular rhythm.     Heart sounds: No murmur heard. Pulmonary:     Effort: Pulmonary effort is normal. No respiratory distress.     Breath sounds: Normal breath sounds.  Abdominal:     Palpations: Abdomen is soft.  Tenderness: There is abdominal tenderness in the suprapubic area.  Musculoskeletal:        General: No swelling.     Cervical back: Neck supple.  Skin:    General: Skin is warm and dry.     Capillary Refill: Capillary refill takes less than 2 seconds.  Neurological:     Mental Status: She is alert.  Psychiatric:        Mood and Affect: Mood normal.     ED Results / Procedures / Treatments   Labs (all labs ordered are listed, but only abnormal results are displayed) Labs Reviewed  COMPREHENSIVE METABOLIC PANEL - Abnormal; Notable for the following components:      Result Value   Creatinine, Ser 1.05 (*)    Total Bilirubin 0.1 (*)    All other components within normal limits  POC OCCULT BLOOD, ED - Abnormal; Notable for the following  components:   Fecal Occult Bld POSITIVE (*)    All other components within normal limits  C DIFFICILE QUICK SCREEN W PCR REFLEX    GASTROINTESTINAL PANEL BY PCR, STOOL (REPLACES STOOL CULTURE)  CBC  URINALYSIS, ROUTINE W REFLEX MICROSCOPIC  TYPE AND SCREEN  TROPONIN I (HIGH SENSITIVITY)    EKG EKG Interpretation  Date/Time:  Saturday Dec 19 2022 10:37:25 EDT Ventricular Rate:  94 PR Interval:  147 QRS Duration: 93 QT Interval:  365 QTC Calculation: 457 R Axis:   76 Text Interpretation: Sinus rhythm Right atrial enlargement Confirmed by Ernie Avena ([id number]) on 12/19/2022 10:48:24 AM  Radiology CT ANGIO GI BLEED  Result Date: 12/19/2022 CLINICAL DATA:  Lower GI bleed. EXAM: CTA ABDOMEN AND PELVIS WITHOUT AND WITH CONTRAST TECHNIQUE: Multidetector CT imaging of the abdomen and pelvis was performed using the standard protocol during bolus administration of intravenous contrast. Multiplanar reconstructed images and MIPs were obtained and reviewed to evaluate the vascular anatomy. RADIATION DOSE REDUCTION: This exam was performed according to the departmental dose-optimization program which includes automated exposure control, adjustment of the mA and/or kV according to patient size and/or use of iterative reconstruction technique. CONTRAST:  75mL OMNIPAQUE IOHEXOL 350 MG/ML SOLN COMPARISON:  CT a abdomen and pelvis 12/10/2022 FINDINGS: VASCULAR Aorta: Normal caliber aorta without aneurysm, dissection, vasculitis or significant stenosis. Celiac: Patent without evidence of aneurysm, dissection, vasculitis or significant stenosis. SMA: Patent without evidence of aneurysm, dissection, vasculitis or significant stenosis. Renals: Both renal arteries are patent without evidence of aneurysm, dissection, vasculitis, fibromuscular dysplasia or significant stenosis. IMA: Patent without evidence of aneurysm, dissection, vasculitis or significant stenosis. Inflow: Patent without evidence of aneurysm,  dissection, vasculitis or significant stenosis. Proximal Outflow: Bilateral common femoral and visualized portions of the superficial and profunda femoral arteries are patent without evidence of aneurysm, dissection, vasculitis or significant stenosis. Veins: No obvious venous abnormality. Review of the MIP images confirms the above findings. NON-VASCULAR Lower chest: The lung bases are clear without focal nodule, mass, or airspace disease. Heart size is normal. No significant pleural or pericardial effusion is present. Hepatobiliary: No focal liver abnormality is seen. No gallstones, gallbladder wall thickening, or biliary dilatation. Pancreas: Unremarkable. No pancreatic ductal dilatation or surrounding inflammatory changes. Spleen: Normal in size without focal abnormality. Adrenals/Urinary Tract: Adrenal glands are unremarkable. Kidneys are normal, without renal calculi, focal lesion, or hydronephrosis. Bladder is unremarkable. Stomach/Bowel: Stomach and duodenum are within. Progressive diffuse wall thickening is present now throughout nearly the entire colon. The proximal colon and cecum are spared. There is some adjacent fat stranding. No free air  free fluid is present. Intraluminal contrast is present on arterial or delayed venous imaging. Lymphatic: Significant abdominal or pelvic adenopathy present. Reproductive: Status post hysterectomy. No adnexal masses. Other: No abdominal wall hernia or abnormality. No abdominopelvic ascites. Musculoskeletal: The vertebral body heights and alignment are normal. Mild rightward curvature is present. No focal osseous lesions are present. The bony pelvis is normal. Hips are located and within normal limits. IMPRESSION: 1. Progressive diffuse wall thickening now throughout nearly the entire colon with some adjacent fat stranding. Findings are consistent with an infectious or inflammatory colitis. This is likely the source lower GI bleed. 2. No evidence for active  gastrointestinal bleeding. 3. Normal and unchanged appearance of the abdominal aorta and branch vessels as described. Electronically Signed   By: Marin Roberts M.D.   On: 12/19/2022 13:12   DG Chest 2 View  Result Date: 12/19/2022 CLINICAL DATA:  Shortness of breath EXAM: CHEST - 2 VIEW COMPARISON:  06/24/2018 FINDINGS: The heart size and mediastinal contours are within normal limits. Both lungs are clear. The visualized skeletal structures are unremarkable. IMPRESSION: No active cardiopulmonary disease. Electronically Signed   By: Duanne Guess D.O.   On: 12/19/2022 11:20    Procedures Procedures   Medications Ordered in ED Medications  SUMAtriptan (IMITREX) tablet 100 mg (has no administration in time range)  ALPRAZolam (XANAX) tablet 0.5 mg (has no administration in time range)  amphetamine-dextroamphetamine (ADDERALL) tablet 0.5 tablet (has no administration in time range)  buPROPion (WELLBUTRIN XL) 24 hr tablet 450 mg (has no administration in time range)  lisdexamfetamine (VYVANSE) capsule 70 mg (has no administration in time range)  PARoxetine (PAXIL) tablet 5 mg (has no administration in time range)  gabapentin (NEURONTIN) capsule 200 mg (has no administration in time range)  pantoprazole (PROTONIX) injection 40 mg (40 mg Intravenous Given 12/19/22 1033)  iohexol (OMNIPAQUE) 350 MG/ML injection 75 mL (75 mLs Intravenous Contrast Given 12/19/22 1237)  sodium chloride 0.9 % bolus 1,000 mL (1,000 mLs Intravenous New Bag/Given 12/19/22 1419)    ED Course/ Medical Decision Making/ A&P                             Medical Decision Making Patient is a 62 y.o. female, here for blood in stoolx2 weeks. Left 9 days ago prior to being admitted to be evaluated at [facility]. Has taken cipro, augmentin for UTI still having diarrhea. Had diarrhea prior to antibiotics. Will obtain GI bleed protocol, blood work, CXR for further eval.   Amount and/or Complexity of Data Reviewed Labs: ordered.     Details: Stable hemoglobin, +hemoccult, no e Radiology: ordered.    Details: CTA shows worsening colitis Discussion of management or test interpretation with external provider(s): Discussed with patient, CTA shows worsening colitis. Discussed with GI Shanda Bumps, she states possible colonoscopy in AM. Admitted to hospitalist Dr. Debby Bud. Stool samples ordered per GI request. Has failed augmentin and cipro for UTI, so possible inflammatory colitis instead of infectious?  Risk Prescription drug management. Decision regarding hospitalization.    Final Clinical Impression(s) / ED Diagnoses Final diagnoses:  Colitis  Gastrointestinal hemorrhage, unspecified gastrointestinal hemorrhage type    Rx / DC Orders ED Discharge Orders     None         Dhruv Christina, Harley [place], PA 12/19/22 1528    Ernie Avena, MD 12/19/22 2030

## 2022-12-19 NOTE — Assessment & Plan Note (Signed)
Patient on multiple medications for her symptoms - generally well controlled.  Plan Continue home regimen

## 2022-12-20 DIAGNOSIS — F419 Anxiety disorder, unspecified: Secondary | ICD-10-CM

## 2022-12-20 DIAGNOSIS — R1084 Generalized abdominal pain: Secondary | ICD-10-CM

## 2022-12-20 DIAGNOSIS — R933 Abnormal findings on diagnostic imaging of other parts of digestive tract: Secondary | ICD-10-CM | POA: Diagnosis not present

## 2022-12-20 DIAGNOSIS — D62 Acute posthemorrhagic anemia: Secondary | ICD-10-CM

## 2022-12-20 DIAGNOSIS — K921 Melena: Secondary | ICD-10-CM

## 2022-12-20 DIAGNOSIS — F32A Depression, unspecified: Secondary | ICD-10-CM

## 2022-12-20 LAB — URINALYSIS, ROUTINE W REFLEX MICROSCOPIC
Bilirubin Urine: NEGATIVE
Glucose, UA: NEGATIVE mg/dL
Hgb urine dipstick: NEGATIVE
Ketones, ur: NEGATIVE mg/dL
Leukocytes,Ua: NEGATIVE
Nitrite: NEGATIVE
Protein, ur: NEGATIVE mg/dL
Specific Gravity, Urine: 1.013 (ref 1.005–1.030)
pH: 6 (ref 5.0–8.0)

## 2022-12-20 LAB — GASTROINTESTINAL PANEL BY PCR, STOOL (REPLACES STOOL CULTURE)

## 2022-12-20 LAB — C DIFFICILE QUICK SCREEN W PCR REFLEX
C Diff antigen: NEGATIVE
C Diff interpretation: NOT DETECTED
C Diff toxin: NEGATIVE

## 2022-12-20 LAB — HEMOGLOBIN AND HEMATOCRIT, BLOOD
HCT: 31.8 % — ABNORMAL LOW (ref 36.0–46.0)
Hemoglobin: 10.4 g/dL — ABNORMAL LOW (ref 12.0–15.0)

## 2022-12-20 MED ORDER — GABAPENTIN 100 MG PO CAPS
200.0000 mg | ORAL_CAPSULE | Freq: Every day | ORAL | Status: DC
  Administered 2022-12-21 – 2022-12-22 (×2): 200 mg via ORAL
  Filled 2022-12-20 (×2): qty 2

## 2022-12-20 MED ORDER — ALPRAZOLAM 0.5 MG PO TABS
0.5000 mg | ORAL_TABLET | Freq: Every day | ORAL | Status: DC | PRN
  Administered 2022-12-21 – 2022-12-22 (×2): 0.5 mg via ORAL
  Filled 2022-12-20 (×2): qty 1

## 2022-12-20 MED ORDER — PAROXETINE HCL 10 MG PO TABS
5.0000 mg | ORAL_TABLET | Freq: Every day | ORAL | Status: DC
  Administered 2022-12-21 – 2022-12-22 (×2): 5 mg via ORAL
  Filled 2022-12-20 (×4): qty 0.5

## 2022-12-20 NOTE — Hospital Course (Signed)
Molly Brady is a 62 years old female with no significant past medical history presented to the hospital for bright red blood per rectum.  Patient was seen in the ED on 12/09/2020 for bright red blood per rectum and was found to have colitis on CT scan with UTI.  Patient was offered admission but declined at that time and was discharged on Augmentin for UTI and colitis.  On 12/11/22 she was seen [facility] ED for same problem. Her symptoms were better, labs were stable and she was discharged with instructions to follow-up with Dr. Loreta Ave, GI on January 05, 2023. Cipro was started. On Dec 17, 2022 she saw Dr. Lenord Fellers, primary care provider for follow-up. She had resolution of abdominal pain and symptoms. She finished augmentin and was to finish Cipro but despite that her symptoms of abdominal fullness and discomfort and BRBPR recurred she presented back to the ED.  In the ED, patient had stable vitals.  Had diffuse abdominal tenderness.  Labs showed a creatinine of 1.0.  CTA of the abdomen showed no evidence of bleeding but progressive diffuse bowel wall thickening.  Patient was then admitted to hospital for possible colonoscopy evaluation and GI follow-up.  Assessment and Plan:  Diffuse colitis Patient with CT abd/pelvis on 12/10/2022 with diffuse colitis with temporary improvement but recurrence of the bleeding and symptoms.  GI was consulted due to repeat CT scan showing diffuse colonic wall thickening.  On full liquid diet.  Had finished Augmentin in the past and was on ciprofloxacin.  Will follow GI recommendations at this time   Anxiety and depression On Wellbutrin, Xanax, and Paxil as outpatient.

## 2022-12-20 NOTE — Progress Notes (Signed)
[facility] Progress Note  Chief Complaint: Bloody diarrhea and abdominal pain  History:  Chart review performed, Dr. Lamar Sprinkles consult note from yesterday reviewed. Admitted with abdominal pain and bloody diarrhea for the last 2 weeks.  Her abdominal pain is much improved after pain medicine.  Continues to have bloody diarrhea.  Denies chest pain dyspnea dysuria or lightheadedness.  Family member at the bedside.   Objective:   Current Facility-Administered Medications:    0.45 % sodium chloride infusion, , Intravenous, Continuous, Norins, Rosalyn Gess, MD, Last Rate: 75 mL/hr at 12/19/22 1639, New Bag at 12/19/22 1639   acetaminophen (TYLENOL) tablet 650 mg, 650 mg, Oral, Q6H PRN **OR** acetaminophen (TYLENOL) suppository 650 mg, 650 mg, Rectal, Q6H PRN, Norins, Rosalyn Gess, MD   ALPRAZolam Prudy Feeler) tablet 0.5 mg, 0.5 mg, Oral, QHS PRN, Norins, Rosalyn Gess, MD   amphetamine-dextroamphetamine (ADDERALL) tablet 0.5 tablet, 15 mg, Oral, Daily PRN, Norins, Rosalyn Gess, MD   buPROPion (WELLBUTRIN XL) 24 hr tablet 450 mg, 450 mg, Oral, Daily, Norins, Rosalyn Gess, MD, 450 mg at 12/20/22 0900   gabapentin (NEURONTIN) capsule 200 mg, 200 mg, Oral, QHS, Norins, Rosalyn Gess, MD   ketorolac (TORADOL) 30 MG/ML injection 30 mg, 30 mg, Intravenous, Q6H PRN, Norins, Rosalyn Gess, MD, 30 mg at 12/20/22 0545   lisdexamfetamine (VYVANSE) capsule 70 mg, 70 mg, Oral, Daily, Norins, Rosalyn Gess, MD, 70 mg at 12/20/22 0901   PARoxetine (PAXIL) tablet 5 mg, 5 mg, Oral, Daily, Norins, Rosalyn Gess, MD   SUMAtriptan (IMITREX) tablet 100 mg, 100 mg, Oral, Q2H PRN, Norins, Rosalyn Gess, MD   sodium chloride 75 mL/hr at 12/19/22 1639     Vital signs in last 24 hrs: Vitals:   12/19/22 2045 12/20/22 0642  BP: 106/64 116/61  Pulse: 71 75  Resp: 16 15  Temp: 98.5 F (36.9 C) 98.3 F (36.8 C)  SpO2: 98% 99%    Intake/Output Summary (Last 24 hours) at 12/20/2022 1017 Last data filed at 12/20/2022 0400 Gross per 24 hour  Intake 848.93  ml  Output --  Net 848.93 ml     Physical Exam Alert and conversational, appears comfortable.  No acute distress. HEENT: sclera anicteric, oral mucosa without lesions Neck: supple, no thyromegaly, JVD or lymphadenopathy Cardiac: RRR without murmurs, S1S2 heard, no peripheral edema Pulm: clear to auscultation bilaterally, normal RR and effort noted Abdomen: soft, mild lower tenderness, with normal active active bowel sounds. No guarding or palpable hepatosplenomegaly.  No distention Skin; warm and dry, no jaundice  Recent Labs:     Latest Ref Rng & Units 12/20/2022    1:15 AM 12/19/2022    9:39 AM 12/10/2022    1:49 PM  CBC  WBC 4.0 - 10.5 K/uL  8.9  7.9   Hemoglobin 12.0 - 15.0 g/dL 19.1  47.8  29.5   Hematocrit 36.0 - 46.0 % 31.8  39.7  38.1   Platelets 150 - 400 K/uL  370  325     No results for input(s): "INR" in the last 168 hours.    Latest Ref Rng & Units 12/19/2022    9:39 AM 12/10/2022    1:49 PM 02/12/2020   10:17 AM  CMP  Glucose 70 - 99 mg/dL 96  92  621   BUN 6 - 20 mg/dL 11  8  17    Creatinine 0.44 - 1.00 mg/dL 3.08  6.57  8.46   Sodium 135 - 145 mmol/L 137  137  138   Potassium 3.5 -  5.1 mmol/L 4.0  3.9  4.4   Chloride 98 - 111 mmol/L 101  100  105   CO2 22 - 32 mmol/L 25  28  28    Calcium 8.9 - 10.3 mg/dL 9.0  9.3  9.2   Total Protein 6.5 - 8.1 g/dL 6.9  7.5  7.1   Total Bilirubin 0.3 - 1.2 mg/dL 0.1  0.3  0.4   Alkaline Phos 38 - 126 U/L 91  83    AST 15 - 41 U/L 24  20  31    ALT 0 - 44 U/L 22  18  37    C. difficile antigen and toxin negative  Full GI pathogen panel results still pending  Radiologic studies:   Assessment & Plan  Assessment: Generalized abdominal pain  Hematochezia  Abnormal imaging suggesting colitis   Awaiting GI pathogen panel, but if that is negative, overall clinical picture highly suggestive of new onset inflammatory bowel disease.  Hemoglobin has dropped since admission.  Needs to be monitored with a CBC  tomorrow.  Hemodynamically stable, exam benign, no evidence toxicity.  Plan: Await results of GI pathogen panel  Continue full liquid diet  CBC tomorrow  Signout will be given to Dr. Luisa Hart Hung/Dr. Charna Elizabeth to plan timing of inpatient colonoscopy.   Charlie Pitter III Office: [phone]

## 2022-12-20 NOTE — Progress Notes (Signed)
PROGRESS NOTE    Molly Brady  KZS:[id number] DOB: 17-May-1961 DOA: 12/19/2022 PCP: Molly Mackintosh, MD    Brief Narrative:  Molly Brady is a 62 years old female with no significant past medical history presented to the hospital for bright red blood per rectum.  Patient was seen in the ED on 12/09/2020 for bright red blood per rectum and was found to have colitis on CT scan with UTI.  Patient was offered admission but declined at that time and was discharged on Augmentin for UTI and colitis.  On 12/11/22 she was seen [facility] for same problem. Her symptoms were better, labs were stable and she was discharged with instructions to follow-up with Molly Brady, GI on January 05, 2023. Cipro was started. On Dec 17, 2022 she saw Molly Brady, primary care provider for follow-up. She had resolution of abdominal pain and symptoms. She finished augmentin and was to finish Cipro but despite that her symptoms of abdominal fullness and discomfort and BRBPR recurred she presented back to the ED.  In the ED, patient had stable vitals.  Had diffuse abdominal tenderness.  Labs showed a creatinine of 1.0.  CTA of the abdomen showed no evidence of bleeding but progressive diffuse bowel wall thickening.  Patient was then admitted to hospital for possible colonoscopy evaluation and GI follow-up.  Assessment and Plan:  Diffuse colitis with hematochezia. Patient with CT abd/pelvis on 12/10/2022 with diffuse colitis with temporary improvement but recurrence of the bleeding and symptoms.  FOBT positive.  GI was consulted due to repeat CT scan showing diffuse colonic wall thickening.  On full liquid diet.  Had finished Augmentin in the past and was on ciprofloxacin.  Will follow GI recommendations at this time.  Might need colonoscopy.  Will hold off with heparin subcu.  Hemoglobin of 10.4.  No leukocytosis at this time.  Patient does not have fever.  C. difficile negative.  Urinalysis negative.  GI pathogen panel pending, type and cross has been  done.  No family history of inflammatory bowel disease.   Anxiety and depression On Wellbutrin, Xanax, and Paxil as outpatient.     DVT prophylaxis: On heparin subcu.  Will discontinue since patient has been having bloody bowel movements.  Will put the patient on SCD.   Code Status:     Code Status: Full Code  Disposition: Home likely in 1 to 2 days  Status is: Inpatient  Remains inpatient appropriate because: Diffuse colitis, GI evaluation, ongoing hematochezia   Family Communication: Family member at bedside.  Consultants:  GI  Procedures:  None  Antimicrobials:  None currently  Anti-infectives (From admission, onward)    None      Subjective: Today, patient was seen and examined at bedside.  Complains of loose bowel movements which were bloody including this morning.  Objective: Vitals:   12/19/22 1419 12/19/22 1630 12/19/22 2045 12/20/22 0642  BP: 114/65 (!) 119/56 106/64 116/61  Pulse: 80 73 71 75  Resp: 16 16 16 15   Temp:  98.2 F (36.8 C) 98.5 F (36.9 C) 98.3 F (36.8 C)  TempSrc:  Oral Oral Oral  SpO2: 100% 100% 98% 99%  Weight:    61.4 kg    Intake/Output Summary (Last 24 hours) at 12/20/2022 0942 Last data filed at 12/20/2022 0400 Gross per 24 hour  Intake 848.93 ml  Output --  Net 848.93 ml   Filed Weights   12/20/22 0642  Weight: 61.4 kg    Physical Examination: Body mass  index is 23.02 kg/m.  General:  Average built, not in obvious distress HENT:   No scleral pallor or icterus noted. Oral mucosa is moist.  Chest:  Clear breath sounds.  Diminished breath sounds bilaterally. No crackles or wheezes.  CVS: S1 &S2 heard. No murmur.  Regular rate and rhythm. Abdomen: Soft, nonspecific diffuse tenderness noted mostly on the lower abdomen, nondistended.  Bowel sounds are heard.   Extremities: No cyanosis, clubbing or edema.  Peripheral pulses are palpable. Psych: Alert, awake and oriented, mildly anxious CNS:  No cranial nerve deficits.   Power equal in all extremities.   Skin: Warm and dry.  No rashes noted.  Data Reviewed:   CBC: Recent Labs  Lab 12/19/22 0939 12/20/22 0115  WBC 8.9  --   HGB 12.9 10.4*  HCT 39.7 31.8*  MCV 91.5  --   PLT 370  --     Basic Metabolic Panel: Recent Labs  Lab 12/19/22 0939  NA 137  K 4.0  CL 101  CO2 25  GLUCOSE 96  BUN 11  CREATININE 1.05*  CALCIUM 9.0    Liver Function Tests: Recent Labs  Lab 12/19/22 0939  AST 24  ALT 22  ALKPHOS 91  BILITOT 0.1*  PROT 6.9  ALBUMIN 3.5     Radiology Studies: CT ANGIO GI BLEED  Result Date: 12/19/2022 CLINICAL DATA:  Lower GI bleed. EXAM: CTA ABDOMEN AND PELVIS WITHOUT AND WITH CONTRAST TECHNIQUE: Multidetector CT imaging of the abdomen and pelvis was performed using the standard protocol during bolus administration of intravenous contrast. Multiplanar reconstructed images and MIPs were obtained and reviewed to evaluate the vascular anatomy. RADIATION DOSE REDUCTION: This exam was performed according to the departmental dose-optimization program which includes automated exposure control, adjustment of the mA and/or kV according to patient size and/or use of iterative reconstruction technique. CONTRAST:  75mL OMNIPAQUE IOHEXOL 350 MG/ML SOLN COMPARISON:  CT a abdomen and pelvis 12/10/2022 FINDINGS: VASCULAR Aorta: Normal caliber aorta without aneurysm, dissection, vasculitis or significant stenosis. Celiac: Patent without evidence of aneurysm, dissection, vasculitis or significant stenosis. SMA: Patent without evidence of aneurysm, dissection, vasculitis or significant stenosis. Renals: Both renal arteries are patent without evidence of aneurysm, dissection, vasculitis, fibromuscular dysplasia or significant stenosis. IMA: Patent without evidence of aneurysm, dissection, vasculitis or significant stenosis. Inflow: Patent without evidence of aneurysm, dissection, vasculitis or significant stenosis. Proximal Outflow: Bilateral common  femoral and visualized portions of the superficial and profunda femoral arteries are patent without evidence of aneurysm, dissection, vasculitis or significant stenosis. Veins: No obvious venous abnormality. Review of the MIP images confirms the above findings. NON-VASCULAR Lower chest: The lung bases are clear without focal nodule, mass, or airspace disease. Heart size is normal. No significant pleural or pericardial effusion is present. Hepatobiliary: No focal liver abnormality is seen. No gallstones, gallbladder wall thickening, or biliary dilatation. Pancreas: Unremarkable. No pancreatic ductal dilatation or surrounding inflammatory changes. Spleen: Normal in size without focal abnormality. Adrenals/Urinary Tract: Adrenal glands are unremarkable. Kidneys are normal, without renal calculi, focal lesion, or hydronephrosis. Bladder is unremarkable. Stomach/Bowel: Stomach and duodenum are within. Progressive diffuse wall thickening is present now throughout nearly the entire colon. The proximal colon and cecum are spared. There is some adjacent fat stranding. No free air free fluid is present. Intraluminal contrast is present on arterial or delayed venous imaging. Lymphatic: Significant abdominal or pelvic adenopathy present. Reproductive: Status post hysterectomy. No adnexal masses. Other: No abdominal wall hernia or abnormality. No abdominopelvic ascites. Musculoskeletal: The  vertebral body heights and alignment are normal. Mild rightward curvature is present. No focal osseous lesions are present. The bony pelvis is normal. Hips are located and within normal limits. IMPRESSION: 1. Progressive diffuse wall thickening now throughout nearly the entire colon with some adjacent fat stranding. Findings are consistent with an infectious or inflammatory colitis. This is likely the source lower GI bleed. 2. No evidence for active gastrointestinal bleeding. 3. Normal and unchanged appearance of the abdominal aorta and  branch vessels as described. Electronically Signed   By: Marin Roberts M.D.   On: 12/19/2022 13:12   DG Chest 2 View  Result Date: 12/19/2022 CLINICAL DATA:  Shortness of breath EXAM: CHEST - 2 VIEW COMPARISON:  06/24/2018 FINDINGS: The heart size and mediastinal contours are within normal limits. Both lungs are clear. The visualized skeletal structures are unremarkable. IMPRESSION: No active cardiopulmonary disease. Electronically Signed   By: Duanne Guess D.O.   On: 12/19/2022 11:20      LOS: 1 day    Joycelyn Das, MD [facility] Available via Epic secure chat 7am-7pm After these hours, please refer to coverage provider listed on [email or web address] 12/20/2022, 9:42 AM

## 2022-12-20 NOTE — Plan of Care (Signed)

## 2022-12-21 DIAGNOSIS — D62 Acute posthemorrhagic anemia: Secondary | ICD-10-CM

## 2022-12-21 LAB — BASIC METABOLIC PANEL
Anion gap: 10 (ref 5–15)
BUN: 6 mg/dL (ref 6–20)
CO2: 26 mmol/L (ref 22–32)
Calcium: 8.6 mg/dL — ABNORMAL LOW (ref 8.9–10.3)
Chloride: 100 mmol/L (ref 98–111)
Creatinine, Ser: 0.92 mg/dL (ref 0.44–1.00)
GFR, Estimated: >60 mL/min (ref >60)
Glucose, Bld: 102 mg/dL — ABNORMAL HIGH (ref 70–99)
Potassium: 4.1 mmol/L (ref 3.5–5.1)
Sodium: 136 mmol/L (ref 135–145)

## 2022-12-21 LAB — CBC
HCT: 34.3 % — ABNORMAL LOW (ref 36.0–46.0)
Hemoglobin: 11.1 g/dL — ABNORMAL LOW (ref 12.0–15.0)
MCH: 29.8 pg (ref 26.0–34.0)
MCHC: 32.4 g/dL (ref 30.0–36.0)
MCV: 92 fL (ref 80.0–100.0)
Platelets: 302 10*3/uL (ref 150–400)
RBC: 3.73 MIL/uL — ABNORMAL LOW (ref 3.87–5.11)
RDW: 12.9 % (ref 11.5–15.5)
WBC: 9.9 10*3/uL (ref 4.0–10.5)
nRBC: 0 % (ref 0.0–0.2)

## 2022-12-21 LAB — MAGNESIUM: Magnesium: 1.9 mg/dL (ref 1.7–2.4)

## 2022-12-21 MED ORDER — PEG 3350-KCL-NA BICARB-NACL 420 G PO SOLR
4000.0000 mL | Freq: Once | ORAL | Status: DC
  Filled 2022-12-21: qty 4000

## 2022-12-21 MED ORDER — SODIUM CHLORIDE 0.9 % IV SOLN: INTRAVENOUS | Status: DC

## 2022-12-21 NOTE — H&P (View-Only) (Signed)
Subjective: No diarrhea as she was not eating.  Objective: Vital signs in last 24 hours: Temp:  [97.6 F (36.4 C)-98.7 F (37.1 C)] 97.6 F (36.4 C) (05/20 2013) Pulse Rate:  [69-86] 79 (05/20 2013) Resp:  [16-18] 18 (05/20 2013) BP: (124-139)/(66-75) 130/67 (05/20 2013) SpO2:  [100 %] 100 % (05/20 2013) Last BM Date : 12/21/22  Intake/Output from previous day: 05/19 0701 - 05/20 0700 In: 1966.8 [I.V.:1966.8] Out: -  Intake/Output this shift: No intake/output data recorded.  General appearance: alert GI: soft, non-tender; bowel sounds normal; no masses,  no organomegaly  Lab Results: Recent Labs    12/19/22 0939 12/20/22 0115 12/21/22 0046  WBC 8.9  --  9.9  HGB 12.9 10.4* 11.1*  HCT 39.7 31.8* 34.3*  PLT 370  --  302   BMET Recent Labs    12/19/22 0939 12/21/22 0046  NA 137 136  K 4.0 4.1  CL 101 100  CO2 25 26  GLUCOSE 96 102*  BUN 11 6  CREATININE 1.05* 0.92  CALCIUM 9.0 8.6*   LFT Recent Labs    12/19/22 0939  PROT 6.9  ALBUMIN 3.5  AST 24  ALT 22  ALKPHOS 91  BILITOT 0.1*   PT/INR No results for input(s): "LABPROT", "INR" in the last 72 hours. Hepatitis Panel No results for input(s): "HEPBSAG", "HCVAB", "HEPAIGM", "HEPBIGM" in the last 72 hours. C-Diff Recent Labs    12/20/22 0140  CDIFFTOX NEGATIVE   Fecal Lactopherrin No results for input(s): "FECLLACTOFRN" in the last 72 hours.  Studies/Results: No results found.  Medications: Scheduled:  buPROPion  450 mg Oral Daily   gabapentin  200 mg Oral Daily   lisdexamfetamine  70 mg Oral Daily   PARoxetine  5 mg Oral Daily   polyethylene glycol-electrolytes  4,000 mL Oral Once   Continuous:  sodium chloride 75 mL/hr at 12/21/22 2024   sodium chloride      Assessment/Plan: 1) Colitis - ? Etiology.  Further evaluation with a colonoscopy is required.  She will be scheduled for the procedure at 2 PM tomorrow.  LOS: 2 days   Kalijah Zeiss D 12/21/2022, 9:59 PM

## 2022-12-21 NOTE — Progress Notes (Signed)
PROGRESS NOTE    DIARY LATNER  BMW:[id number] DOB: 23-Aug-1960 DOA: 12/19/2022 PCP: Margaree Mackintosh, MD    Brief Narrative:  Mrs. Koetter is a 62 years old female with no significant past medical history presented to the hospital for bright red blood per rectum.  Patient was seen in the ED on 12/09/2020 for bright red blood per rectum and was found to have colitis on CT scan with UTI.  Patient was offered admission but declined at that time and was discharged on Augmentin for UTI and colitis.  On 12/11/22 she was seen [facility] ED for same problem. Her symptoms were better, labs were stable and she was discharged with instructions to follow-up with Dr. Loreta Ave, GI on January 05, 2023. Cipro was started. On Dec 17, 2022 she saw Dr. Lenord Fellers, primary care provider for follow-up. She had resolution of abdominal pain and symptoms. She finished augmentin and was to finish Cipro but despite that her symptoms of abdominal fullness and discomfort and BRBPR recurred she presented back to the ED.  In the ED, patient had stable vitals.  Had diffuse abdominal tenderness.  Labs showed a creatinine of 1.0.  CTA of the abdomen showed no evidence of bleeding but progressive diffuse bowel wall thickening.  Patient was then admitted to hospital for possible colonoscopy evaluation and GI follow-up.  Assessment and Plan:   Diffuse colitis with hematochezia. Patient with CT abd/pelvis on 12/10/2022 with diffuse colitis with temporary improvement but recurrence of the bleeding and symptoms.  FOBT positive.  GI was consulted due to repeat CT scan showing diffuse colonic wall thickening.   Had finished Augmentin in the past and was on ciprofloxacin.  Hemoglobin of 11.1.  No leukocytosis at this time. C. difficile negative.  Urinalysis negative.  GI pathogen panel negative.  GI has seen the patient today and recommendation is colonoscopy.     Anxiety and depression On Wellbutrin, Xanax, and Paxil as outpatient.     DVT prophylaxis:   SCD   Code Status:     Code Status: Full Code  Disposition: Home likely in 1 to 2 days pending colonoscopy evaluation  Status is: Inpatient  Remains inpatient appropriate because: Diffuse colitis, possible colonoscopy evaluation   Family Communication:  Spoke with the patient's family member at bedside.  Consultants:  GI  Procedures:  None  Antimicrobials:  None currently  Anti-infectives (From admission, onward)    None      Subjective: Today, patient was seen and examined at bedside.  Complains of no nausea vomiting fever or chills.  Has not had more bloody bowel movement but is upset about being in the hospital for colonoscopy.  Family at bedside.    Objective: Vitals:   12/20/22 1645 12/20/22 2002 12/21/22 0340 12/21/22 0714  BP: (!) 146/72 (!) 116/59 124/66 124/75  Pulse: 80 83 69 80  Resp: 16 18 17 16   Temp: 98.1 F (36.7 C) 97.7 F (36.5 C) 98.1 F (36.7 C) 98.4 F (36.9 C)  TempSrc: Oral Oral Oral Oral  SpO2: 100% 99% 100% 100%  Weight:        Intake/Output Summary (Last 24 hours) at 12/21/2022 1116 Last data filed at 12/21/2022 0630 Gross per 24 hour  Intake 1966.81 ml  Output --  Net 1966.81 ml    Filed Weights   12/20/22 0642  Weight: 61.4 kg    Physical Examination: Body mass index is 23.02 kg/m.   General:  Average built, not in obvious distress, mildly anxious, HENT:  No scleral pallor or icterus noted. Oral mucosa is moist.  Chest:  Clear breath sounds.  Diminished breath sounds bilaterally. No crackles or wheezes.  CVS: S1 &S2 heard. No murmur.  Regular rate and rhythm. Abdomen: Soft, nonspecific tenderness.  Nondistended.  Bowel sounds are heard.   Extremities: No cyanosis, clubbing or edema.  Peripheral pulses are palpable. Psych: Alert, awake and oriented,  CNS:  No cranial nerve deficits.  Power equal in all extremities.   Skin: Warm and dry.  No rashes noted.  Data Reviewed:   CBC: Recent Labs  Lab 12/19/22 0939  12/20/22 0115 12/21/22 0046  WBC 8.9  --  9.9  HGB 12.9 10.4* 11.1*  HCT 39.7 31.8* 34.3*  MCV 91.5  --  92.0  PLT 370  --  302     Basic Metabolic Panel: Recent Labs  Lab 12/19/22 0939 12/21/22 0046  NA 137 136  K 4.0 4.1  CL 101 100  CO2 25 26  GLUCOSE 96 102*  BUN 11 6  CREATININE 1.05* 0.92  CALCIUM 9.0 8.6*  MG  --  1.9     Liver Function Tests: Recent Labs  Lab 12/19/22 0939  AST 24  ALT 22  ALKPHOS 91  BILITOT 0.1*  PROT 6.9  ALBUMIN 3.5      Radiology Studies: CT ANGIO GI BLEED  Result Date: 12/19/2022 CLINICAL DATA:  Lower GI bleed. EXAM: CTA ABDOMEN AND PELVIS WITHOUT AND WITH CONTRAST TECHNIQUE: Multidetector CT imaging of the abdomen and pelvis was performed using the standard protocol during bolus administration of intravenous contrast. Multiplanar reconstructed images and MIPs were obtained and reviewed to evaluate the vascular anatomy. RADIATION DOSE REDUCTION: This exam was performed according to the departmental dose-optimization program which includes automated exposure control, adjustment of the mA and/or kV according to patient size and/or use of iterative reconstruction technique. CONTRAST:  75mL OMNIPAQUE IOHEXOL 350 MG/ML SOLN COMPARISON:  CT a abdomen and pelvis 12/10/2022 FINDINGS: VASCULAR Aorta: Normal caliber aorta without aneurysm, dissection, vasculitis or significant stenosis. Celiac: Patent without evidence of aneurysm, dissection, vasculitis or significant stenosis. SMA: Patent without evidence of aneurysm, dissection, vasculitis or significant stenosis. Renals: Both renal arteries are patent without evidence of aneurysm, dissection, vasculitis, fibromuscular dysplasia or significant stenosis. IMA: Patent without evidence of aneurysm, dissection, vasculitis or significant stenosis. Inflow: Patent without evidence of aneurysm, dissection, vasculitis or significant stenosis. Proximal Outflow: Bilateral common femoral and visualized portions  of the superficial and profunda femoral arteries are patent without evidence of aneurysm, dissection, vasculitis or significant stenosis. Veins: No obvious venous abnormality. Review of the MIP images confirms the above findings. NON-VASCULAR Lower chest: The lung bases are clear without focal nodule, mass, or airspace disease. Heart size is normal. No significant pleural or pericardial effusion is present. Hepatobiliary: No focal liver abnormality is seen. No gallstones, gallbladder wall thickening, or biliary dilatation. Pancreas: Unremarkable. No pancreatic ductal dilatation or surrounding inflammatory changes. Spleen: Normal in size without focal abnormality. Adrenals/Urinary Tract: Adrenal glands are unremarkable. Kidneys are normal, without renal calculi, focal lesion, or hydronephrosis. Bladder is unremarkable. Stomach/Bowel: Stomach and duodenum are within. Progressive diffuse wall thickening is present now throughout nearly the entire colon. The proximal colon and cecum are spared. There is some adjacent fat stranding. No free air free fluid is present. Intraluminal contrast is present on arterial or delayed venous imaging. Lymphatic: Significant abdominal or pelvic adenopathy present. Reproductive: Status post hysterectomy. No adnexal masses. Other: No abdominal wall hernia or abnormality. No abdominopelvic  ascites. Musculoskeletal: The vertebral body heights and alignment are normal. Mild rightward curvature is present. No focal osseous lesions are present. The bony pelvis is normal. Hips are located and within normal limits. IMPRESSION: 1. Progressive diffuse wall thickening now throughout nearly the entire colon with some adjacent fat stranding. Findings are consistent with an infectious or inflammatory colitis. This is likely the source lower GI bleed. 2. No evidence for active gastrointestinal bleeding. 3. Normal and unchanged appearance of the abdominal aorta and branch vessels as described.  Electronically Signed   By: Marin Roberts M.D.   On: 12/19/2022 13:12      LOS: 2 days    Joycelyn Das, MD [facility] Available via Epic secure chat 7am-7pm After these hours, please refer to coverage provider listed on [email or web address] 12/21/2022, 11:16 AM

## 2022-12-21 NOTE — Progress Notes (Signed)
Subjective: No diarrhea as she was not eating.  Objective: Vital signs in last 24 hours: Temp:  [97.6 F (36.4 C)-98.7 F (37.1 C)] 97.6 F (36.4 C) (05/20 2013) Pulse Rate:  [69-86] 79 (05/20 2013) Resp:  [16-18] 18 (05/20 2013) BP: (124-139)/(66-75) 130/67 (05/20 2013) SpO2:  [100 %] 100 % (05/20 2013) Last BM Date : 12/21/22  Intake/Output from previous day: 05/19 0701 - 05/20 0700 In: 1966.8 [I.V.:1966.8] Out: -  Intake/Output this shift: No intake/output data recorded.  General appearance: alert GI: soft, non-tender; bowel sounds normal; no masses,  no organomegaly  Lab Results: Recent Labs    12/19/22 0939 12/20/22 0115 12/21/22 0046  WBC 8.9  --  9.9  HGB 12.9 10.4* 11.1*  HCT 39.7 31.8* 34.3*  PLT 370  --  302   BMET Recent Labs    12/19/22 0939 12/21/22 0046  NA 137 136  K 4.0 4.1  CL 101 100  CO2 25 26  GLUCOSE 96 102*  BUN 11 6  CREATININE 1.05* 0.92  CALCIUM 9.0 8.6*   LFT Recent Labs    12/19/22 0939  PROT 6.9  ALBUMIN 3.5  AST 24  ALT 22  ALKPHOS 91  BILITOT 0.1*   PT/INR No results for input(s): "LABPROT", "INR" in the last 72 hours. Hepatitis Panel No results for input(s): "HEPBSAG", "HCVAB", "HEPAIGM", "HEPBIGM" in the last 72 hours. C-Diff Recent Labs    12/20/22 0140  CDIFFTOX NEGATIVE   Fecal Lactopherrin No results for input(s): "FECLLACTOFRN" in the last 72 hours.  Studies/Results: No results found.  Medications: Scheduled:  buPROPion  450 mg Oral Daily   gabapentin  200 mg Oral Daily   lisdexamfetamine  70 mg Oral Daily   PARoxetine  5 mg Oral Daily   polyethylene glycol-electrolytes  4,000 mL Oral Once   Continuous:  sodium chloride 75 mL/hr at 12/21/22 2024   sodium chloride      Assessment/Plan: 1) Colitis - ? Etiology.  Further evaluation with a colonoscopy is required.  She will be scheduled for the procedure at 2 PM tomorrow.  LOS: 2 days   Nayan Proch D 12/21/2022, 9:59 PM 

## 2022-12-21 NOTE — Plan of Care (Signed)

## 2022-12-22 ENCOUNTER — Encounter (HOSPITAL_COMMUNITY)
Admission: EM | Disposition: A | Discharge status: Home / Self Care | Payer: Self-pay | Source: Home / Self Care | Attending: Internal Medicine

## 2022-12-22 ENCOUNTER — Inpatient Hospital Stay (HOSPITAL_COMMUNITY): Payer: BC Managed Care – PPO | Admitting: Certified Registered Nurse Anesthetist

## 2022-12-22 ENCOUNTER — Encounter (HOSPITAL_COMMUNITY): Payer: Self-pay | Admitting: Internal Medicine

## 2022-12-22 HISTORY — PX: BIOPSY: SHX5522

## 2022-12-22 HISTORY — PX: COLONOSCOPY WITH PROPOFOL: SHX5780

## 2022-12-22 LAB — MAGNESIUM: Magnesium: 2 mg/dL (ref 1.7–2.4)

## 2022-12-22 LAB — BASIC METABOLIC PANEL
Anion gap: 9 (ref 5–15)
BUN: <5 mg/dL — ABNORMAL LOW (ref 6–20)
CO2: 25 mmol/L (ref 22–32)
Calcium: 8.6 mg/dL — ABNORMAL LOW (ref 8.9–10.3)
Chloride: 103 mmol/L (ref 98–111)
Creatinine, Ser: 0.93 mg/dL (ref 0.44–1.00)
GFR, Estimated: >60 mL/min (ref >60)
Glucose, Bld: 104 mg/dL — ABNORMAL HIGH (ref 70–99)
Potassium: 3.5 mmol/L (ref 3.5–5.1)
Sodium: 137 mmol/L (ref 135–145)

## 2022-12-22 LAB — CBC
HCT: 33.9 % — ABNORMAL LOW (ref 36.0–46.0)
Hemoglobin: 11.5 g/dL — ABNORMAL LOW (ref 12.0–15.0)
MCH: 30.4 pg (ref 26.0–34.0)
MCHC: 33.9 g/dL (ref 30.0–36.0)
MCV: 89.7 fL (ref 80.0–100.0)
Platelets: 325 10*3/uL (ref 150–400)
RBC: 3.78 MIL/uL — ABNORMAL LOW (ref 3.87–5.11)
RDW: 12.7 % (ref 11.5–15.5)
WBC: 7.7 10*3/uL (ref 4.0–10.5)
nRBC: 0 % (ref 0.0–0.2)

## 2022-12-22 SURGERY — COLONOSCOPY WITH PROPOFOL
Anesthesia: Monitor Anesthesia Care

## 2022-12-22 MED ORDER — LACTATED RINGERS IV SOLN: INTRAVENOUS | Status: DC

## 2022-12-22 MED ORDER — PROPOFOL 500 MG/50ML IV EMUL
INTRAVENOUS | Status: DC | PRN
  Administered 2022-12-22: 100 ug/kg/min via INTRAVENOUS

## 2022-12-22 MED ORDER — METHYLPREDNISOLONE SODIUM SUCC 125 MG IJ SOLR
40.0000 mg | Freq: Two times a day (BID) | INTRAMUSCULAR | Status: DC
  Administered 2022-12-22 – 2022-12-23 (×3): 40 mg via INTRAVENOUS
  Filled 2022-12-22 (×3): qty 2

## 2022-12-22 MED ORDER — LIDOCAINE 2% (20 MG/ML) 5 ML SYRINGE
INTRAMUSCULAR | Status: DC | PRN
  Administered 2022-12-22: 60 mg via INTRAVENOUS

## 2022-12-22 MED ORDER — PROPOFOL 10 MG/ML IV BOLUS
INTRAVENOUS | Status: DC | PRN
  Administered 2022-12-22: 35 mg via INTRAVENOUS
  Administered 2022-12-22 (×3): 20 mg via INTRAVENOUS

## 2022-12-22 SURGICAL SUPPLY — 22 items

## 2022-12-22 NOTE — Plan of Care (Signed)

## 2022-12-22 NOTE — TOC CM/SW Note (Signed)
  Transition of Care [facility]) Screening Note   Patient Details  Name: Molly Brady Date of Birth: Dec 14, 1960   Transition of Care [facility]) CM/SW Contact:    Kingsley Plan, RN Phone Number: 12/22/2022, 10:22 AM  Plan colonoscopy today   Transition of Care Department [facility]) has reviewed patient and no TOC needs have been identified at this time. We will continue to monitor patient advancement through interdisciplinary progression rounds. If new patient transition needs arise, please place a TOC consult.

## 2022-12-22 NOTE — Transfer of Care (Signed)
Immediate Anesthesia Transfer of Care Note  Patient: Molly Brady  Procedure(s) Performed: COLONOSCOPY WITH PROPOFOL BIOPSY  Patient Location: Endoscopy Unit  Anesthesia Type:MAC  Level of Consciousness: drowsy  Airway & Oxygen Therapy: Patient Spontanous Breathing and Patient connected to nasal cannula oxygen  Post-op Assessment: Report given to RN and Post -op Vital signs reviewed and stable  Post vital signs: Reviewed and stable  Last Vitals:  Vitals Value Taken Time  BP 103/44   Temp    Pulse 83 12/22/22 1429  Resp 20 12/22/22 1429  SpO2 100 % 12/22/22 1429  Vitals shown include unvalidated device data.  Last Pain:  Vitals:   12/22/22 1319  TempSrc: Temporal  PainSc: 0-No pain      Patients Stated Pain Goal: 0 (12/22/22 1033)  Complications: No notable events documented.

## 2022-12-22 NOTE — Anesthesia Postprocedure Evaluation (Signed)
Anesthesia Post Note  Patient: ALONNAH WOODROW  Procedure(s) Performed: COLONOSCOPY WITH PROPOFOL BIOPSY     Patient location during evaluation: Endoscopy Anesthesia Type: MAC Level of consciousness: awake and alert Pain management: pain level controlled Vital Signs Assessment: post-procedure vital signs reviewed and stable Respiratory status: spontaneous breathing and respiratory function stable Cardiovascular status: stable Postop Assessment: no apparent nausea or vomiting Anesthetic complications: no   No notable events documented.  Last Vitals:  Vitals:   12/22/22 1445 12/22/22 1450  BP:  133/74  Pulse: 86 82  Resp: 12 16  Temp:    SpO2: 98% 99%    Last Pain:  Vitals:   12/22/22 1450  TempSrc:   PainSc: 0-No pain                 Fabiano Ginley DANIEL

## 2022-12-22 NOTE — Op Note (Signed)
[facility] Patient Name: Molly Brady Procedure Date : 12/22/2022 MRN: [id number] Attending MD: Jeani Hawking , MD, [id number] Date of Birth: Feb 26, 1961 CSN: [id number] Age: 62 Admit Type: Inpatient Procedure:                Colonoscopy Indications:              Abnormal CT of the GI tract Providers:                Jeani Hawking, MD, Geralyn Corwin, RN, Salley Scarlet, Technician, Sharyn Lull, CRNA Referring MD:              Medicines:                Propofol per Anesthesia Complications:            No immediate complications. Estimated Blood Loss:     Estimated blood loss: none. Procedure:                Pre-Anesthesia Assessment:                           - Prior to the procedure, a History and Physical                            was performed, and patient medications and                            allergies were reviewed. The patient's tolerance of                            previous anesthesia was also reviewed. The risks                            and benefits of the procedure and the sedation                            options and risks were discussed with the patient.                            All questions were answered, and informed consent                            was obtained. Prior Anticoagulants: The patient has                            taken no anticoagulant or antiplatelet agents. ASA                            Grade Assessment: II - A patient with mild systemic                            disease. After reviewing the risks and benefits,  the patient was deemed in satisfactory condition to                            undergo the procedure.                           - Sedation was administered by an anesthesia                            professional. Deep sedation was attained.                           After obtaining informed consent, the colonoscope                            was passed under  direct vision. Throughout the                            procedure, the patient's blood pressure, pulse, and                            oxygen saturations were monitored continuously. The                            CF-HQ190L ([id number] Olympus coloscope was                            introduced through the anus and advanced to the the                            terminal ileum. The colonoscopy was performed                            without difficulty. The patient tolerated the                            procedure well. The quality of the bowel                            preparation was evaluated using the BBPS [facility]                            Bowel Preparation Scale) with scores of: Right                            Colon = 1 (portion of mucosa seen, but other areas                            not well seen due to staining, residual stool                            and/or opaque liquid), Transverse Colon = 1                            (  portion of mucosa seen, but other areas not well                            seen due to staining, residual stool and/or opaque                            liquid) and Left Colon = 1 (portion of mucosa seen,                            but other areas not well seen due to staining,                            residual stool and/or opaque liquid). The total                            BBPS score equals 3. Adequate. The terminal ileum,                            ileocecal valve, appendiceal orifice, and rectum                            were photographed. Scope In: 2:09:42 PM Scope Out: 2:24:15 PM Scope Withdrawal Time: 0 hours 9 minutes 9 seconds  Total Procedure Duration: 0 hours 14 minutes 33 seconds  Findings:      Inflammation characterized by congestion (edema), erosions, erythema,       friability, granularity, loss of vascularity and mucus was found in a       continuous and circumferential pattern from the anus to the cecum. No       sites were spared.  The inflammation was graded as Mayo Score 3 (severe,       with spontaneous bleeding, ulcerations). Biopsies were taken with a cold       forceps for histology.      The current findings are consistent with a severe pan UC. Impression:               - Pancolitis. Inflammation was found from the anus                            to the cecum. This was graded as Mayo Score 3                            (severe disease). Biopsied. Recommendation:           - Return patient to hospital ward for ongoing care.                           - Resume regular diet.                           - Continue present medications.                           - Await pathology results.                           -  Start Solumedrol 40 mg IV q12 hours. Procedure Code(s):        --- Professional ---                           [phone], Colonoscopy, flexible; with biopsy, single                            or multiple Diagnosis Code(s):        --- Professional ---                           K52.9, Noninfective gastroenteritis and colitis,                            unspecified                           R93.3, Abnormal findings on diagnostic imaging of                            other parts of digestive tract CPT copyright 2022 American Medical Association. All rights reserved. The codes documented in this report are preliminary and upon coder review may  be revised to meet current compliance requirements. Jeani Hawking, MD Jeani Hawking, MD 12/22/2022 2:43:27 PM This report has been signed electronically. Number of Addenda: 0

## 2022-12-22 NOTE — Anesthesia Procedure Notes (Signed)
Procedure Name: MAC Date/Time: 12/22/2022 2:05 PM  Performed by: Annabelle Harman A, CRNAPre-anesthesia Checklist: Patient identified, Suction available, Emergency Drugs available and Patient being monitored Patient Re-evaluated:Patient Re-evaluated prior to induction Oxygen Delivery Method: Nasal cannula Induction Type: IV induction Placement Confirmation: positive ETCO2 Dental Injury: Teeth and Oropharynx as per pre-operative assessment

## 2022-12-22 NOTE — Progress Notes (Signed)
PROGRESS NOTE    Molly Brady  ZOX:[id number] DOB: October 27, 1960 DOA: 12/19/2022 PCP: Molly Mackintosh, MD    Brief Narrative:   Mrs. Molly Brady is a 62 years old female with no significant past medical history presented to the hospital for bright red blood per rectum.  Patient was seen in the ED on 12/09/2020 for bright red blood per rectum and was found to have colitis on CT scan with UTI.  Patient was offered admission but declined at that time and was discharged on Augmentin for UTI and colitis.  On 12/11/22 she was seen [facility] ED for same problem. Her symptoms were better, labs were stable and she was discharged with instructions to follow-up with Dr. Loreta Brady, GI on January 05, 2023. Cipro was started. On Dec 17, 2022 she saw Molly Brady, primary care provider for follow-up. She had resolution of abdominal pain and symptoms. She finished augmentin and was to finish Cipro but despite that her symptoms of abdominal fullness and discomfort and BRBPR recurred she presented back to the ED.  In the ED, patient had stable vitals.  Had diffuse abdominal tenderness.  Labs showed a creatinine of 1.0.  CTA of the abdomen showed no evidence of bleeding but progressive diffuse bowel wall thickening.  Patient was then admitted to the hospital for possible colonoscopy evaluation and GI follow-up.  Assessment and Plan:   Diffuse colitis with hematochezia. Patient with CT abd/pelvis on 12/10/2022 with diffuse colitis with temporary improvement but recurrence of the bleeding and symptoms.  FOBT positive.  GI was consulted due to repeat CT scan showing diffuse colonic wall thickening.   Had finished Augmentin in the past and was on ciprofloxacin.  Hemoglobin of 11.1.  No leukocytosis at this time. C. difficile negative.  Urinalysis negative.  GI pathogen panel negative.  GI plans for colonoscopy evaluation today.  Anxiety and depression On Wellbutrin, Xanax, and Paxil as outpatient.     DVT prophylaxis:  SCD   Code Status:      Code Status: Full Code  Disposition: Home likely in 1 to 2 days, pending colonoscopy evaluation  Status is: Inpatient  Remains inpatient appropriate because: Diffuse colitis, for colonoscopy evaluation.   Family Communication:  Spoke with the patient's daughter at bedside.  Consultants:  GI  Procedures:  None  Antimicrobials:  None currently  Anti-infectives (From admission, onward)    None      Subjective: Today, patient was seen and examined today.  Patient undergoing colonoscopy preparation today.  Patient's daughter at bedside.  No further bloody stool.    Objective: Vitals:   12/21/22 1544 12/21/22 2013 12/22/22 0403 12/22/22 0824  BP: 139/69 130/67 120/61 124/66  Pulse: 86 79 81 74  Resp: 16 18 17 17   Temp: 98.7 F (37.1 C) 97.6 F (36.4 C) 98.2 F (36.8 C) 97.7 F (36.5 C)  TempSrc: Oral Oral Oral Oral  SpO2: 100% 100% 97% 100%  Weight:        Intake/Output Summary (Last 24 hours) at 12/22/2022 1115 Last data filed at 12/21/2022 1658 Gross per 24 hour  Intake 784.28 ml  Output --  Net 784.28 ml    Filed Weights   12/20/22 0642  Weight: 61.4 kg    Physical Examination: Body mass index is 23.02 kg/m.   General:  Average built, not in obvious distress, mildly anxious, HENT:   No scleral pallor or icterus noted. Oral mucosa is moist.  Chest:  Clear breath sounds.  No crackles or wheezes.  CVS: S1 &S2 heard. No murmur.  Regular rate and rhythm. Abdomen: Soft, nonspecific tenderness, nondistended, bowel sounds are present. Extremities: No cyanosis, clubbing or edema.  Peripheral pulses are palpable. Psych: Alert, awake and oriented,  CNS:  No cranial nerve deficits.  Power equal in all extremities.   Skin: Warm and dry.  No rashes noted.  Data Reviewed:   CBC: Recent Labs  Lab 12/19/22 0939 12/20/22 0115 12/21/22 0046 12/22/22 0308  WBC 8.9  --  9.9 7.7  HGB 12.9 10.4* 11.1* 11.5*  HCT 39.7 31.8* 34.3* 33.9*  MCV 91.5  --  92.0 89.7   PLT 370  --  302 325     Basic Metabolic Panel: Recent Labs  Lab 12/19/22 0939 12/21/22 0046 12/22/22 0308  NA 137 136 137  K 4.0 4.1 3.5  CL 101 100 103  CO2 25 26 25   GLUCOSE 96 102* 104*  BUN 11 6 <5*  CREATININE 1.05* 0.92 0.93  CALCIUM 9.0 8.6* 8.6*  MG  --  1.9 2.0     Liver Function Tests: Recent Labs  Lab 12/19/22 0939  AST 24  ALT 22  ALKPHOS 91  BILITOT 0.1*  PROT 6.9  ALBUMIN 3.5      Radiology Studies: No results found.    LOS: 3 days    Joycelyn Das, MD [facility] Available via Epic secure chat 7am-7pm After these hours, please refer to coverage provider listed on [email or web address] 12/22/2022, 11:15 AM

## 2022-12-22 NOTE — Interval H&P Note (Signed)
History and Physical Interval Note:  12/22/2022 1:58 PM  Molly Brady  has presented today for surgery, with the diagnosis of Colitis.  The various methods of treatment have been discussed with the patient and family. After consideration of risks, benefits and other options for treatment, the patient has consented to  Procedure(s): COLONOSCOPY WITH PROPOFOL (N/A) as a surgical intervention.  The patient's history has been reviewed, patient examined, no change in status, stable for surgery.  I have reviewed the patient's chart and labs.  Questions were answered to the patient's satisfaction.     Pantera Winterrowd D

## 2022-12-22 NOTE — Anesthesia Preprocedure Evaluation (Addendum)
Anesthesia Evaluation  Patient identified by MRN, date of birth, ID band Patient awake    Reviewed: Allergy & Precautions, NPO status , Patient's Chart, lab work & pertinent test results  History of Anesthesia Complications Negative for: history of anesthetic complications  Airway Mallampati: II  TM Distance: >3 FB Neck ROM: Full    Dental no notable dental hx. (+) Dental Advisory Given   Pulmonary neg pulmonary ROS   Pulmonary exam normal        Cardiovascular negative cardio ROS Normal cardiovascular exam     Neuro/Psych  PSYCHIATRIC DISORDERS Anxiety Depression    negative neurological ROS     GI/Hepatic negative GI ROS, Neg liver ROS,,,  Endo/Other  negative endocrine ROS    Renal/GU negative Renal ROS     Musculoskeletal negative musculoskeletal ROS (+)    Abdominal   Peds  Hematology  (+) Blood dyscrasia, anemia   Anesthesia Other Findings   Reproductive/Obstetrics                             Anesthesia Physical Anesthesia Plan  ASA: 2  Anesthesia Plan: MAC   Post-op Pain Management: Minimal or no pain anticipated   Induction:   PONV Risk Score and Plan: 2 and Ondansetron and Propofol infusion  Airway Management Planned: Natural Airway  Additional Equipment:   Intra-op Plan:   Post-operative Plan:   Informed Consent: I have reviewed the patients History and Physical, chart, labs and discussed the procedure including the risks, benefits and alternatives for the proposed anesthesia with the patient or authorized representative who has indicated his/her understanding and acceptance.     Dental advisory given  Plan Discussed with: Anesthesiologist and CRNA  Anesthesia Plan Comments:        Anesthesia Quick Evaluation

## 2022-12-23 ENCOUNTER — Other Ambulatory Visit (HOSPITAL_COMMUNITY): Payer: Self-pay

## 2022-12-23 DIAGNOSIS — K51019 Ulcerative (chronic) pancolitis with unspecified complications: Secondary | ICD-10-CM

## 2022-12-23 LAB — SURGICAL PATHOLOGY

## 2022-12-23 MED ORDER — MESALAMINE 1.2 G PO TBEC
4.8000 g | DELAYED_RELEASE_TABLET | Freq: Every day | ORAL | 2 refills | Status: DC
  Filled 2022-12-23: qty 120, 30d supply, fill #0
  Filled 2023-01-21: qty 120, 30d supply, fill #1
  Filled 2023-02-17: qty 120, 30d supply, fill #2

## 2022-12-23 MED ORDER — MESALAMINE 1.2 G PO TBEC
2.4000 g | DELAYED_RELEASE_TABLET | Freq: Every day | ORAL | Status: DC
  Filled 2022-12-23: qty 2

## 2022-12-23 MED ORDER — PREDNISONE 10 MG PO TABS
40.0000 mg | ORAL_TABLET | Freq: Every day | ORAL | 0 refills | Status: DC
  Filled 2022-12-23: qty 120, 30d supply, fill #0

## 2022-12-23 MED ORDER — MESALAMINE 1.2 G PO TBEC
4.8000 g | DELAYED_RELEASE_TABLET | Freq: Every day | ORAL | Status: DC
  Administered 2022-12-23: 4.8 g via ORAL
  Filled 2022-12-23: qty 4

## 2022-12-23 NOTE — Discharge Summary (Signed)
Physician Discharge Summary  Molly Brady WJX:[id number] DOB: Apr 04, 1961 DOA: 12/19/2022  PCP: Margaree Mackintosh, MD  Admit date: 12/19/2022 Discharge date: 12/23/2022  Admitted From: Home  Discharge disposition: Home   Recommendations for Outpatient Follow-Up:   Follow up with your primary care provider in one week.  Check CBC, BMP, magnesium in the next visit Follow-up with Dr. Loreta Ave GI in 1 week to discuss about biologic treatment.  Discharge Diagnosis:   Principal Problem:   Ulcerative colitis (HCC) Active Problems:   Anxiety and depression   Hematochezia   Acute blood loss anemia   Generalized abdominal pain   Abnormal finding on GI tract imaging   Discharge Condition: Improved.  Diet recommendation:   Regular.  Wound care: None.  Code status: Full.   History of Present Illness:   Mrs. Molly Brady is a 62 years old female with no significant past medical history presented to the hospital for bright red blood per rectum.  Patient was seen in the ED on 12/09/2020 for bright red blood per rectum and was found to have colitis on CT scan with UTI.  Patient was offered admission but declined at that time and was discharged on Augmentin for UTI and colitis.  On 12/11/22 she was seen [facility] ED for same problem. Her symptoms were better, labs were stable and she was discharged with instructions to follow-up with Dr. Loreta Ave, GI on January 05, 2023. Cipro was started. On Dec 17, 2022 she saw Dr. Lenord Fellers, primary care provider for follow-up. She had resolution of abdominal pain and symptoms. She finished augmentin and was to finish Cipro but despite that her symptoms of abdominal fullness and discomfort and BRBPR recurred she presented back to the ED.  In the ED, patient had stable vitals.  Had diffuse abdominal tenderness.  Labs showed a creatinine of 1.0.  CTA of the abdomen showed no evidence of bleeding but progressive diffuse bowel wall thickening.  Patient was then admitted to the hospital for  possible colonoscopy evaluation and GI follow-up.    Hospital Course:   Following conditions were addressed during hospitalization as listed below,  Diffuse colitis with hematochezia likely secondary to severe ulcerative colitis..  Patient with CT abd/pelvis on 12/10/2022 with diffuse colitis with temporary improvement but recurrence of the bleeding and symptoms.  FOBT positive.  GI was consulted due to repeat CT scan showing diffuse colonic wall thickening.   Had finished Augmentin in the past and was on ciprofloxacin.  Hemoglobin of 11.1.  No leukocytosis at this time. C. difficile negative.  Urinalysis negative.  GI pathogen panel negative.  Patient underwent colonoscopy evaluation on 12/22/2022 with a clinical diagnosis of ulcerative colitis.  GI has recommended IV Solu-Medrol followed by oral prednisone 40 mg daily until seen as outpatient.  Patient will be started on Lialda 4.8 g daily as well.  Plan is to follow-up with Dr. Loreta Ave GI in 1 week after discharge.   Anxiety and depression On Wellbutrin, Xanax, and Paxil as outpatient.  Will continue on discharge.    Disposition.  At this time, patient is stable for disposition home with outpatient PCP and GI follow-up  Medical Consultants:   GI  Procedures:    Colonoscopy with biopsy on 12/22/2022 Subjective:   Today, patient was seen and examined at bedside.  Denies any nausea vomiting abdominal pain.  Has had bowel movement yesterday.  No bloody bowel movements.  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  Discharge Exam:   Vitals:  12/23/22 0526 12/23/22 0738  BP: 137/84 138/72  Pulse: 90 91  Resp: 16 17  Temp: 98.6 F (37 C) 97.8 F (36.6 C)  SpO2: 99% 99%   Vitals:   12/22/22 1554 12/22/22 1944 12/23/22 0526 12/23/22 0738  BP: (!) 136/59 113/62 137/84 138/72  Pulse: 88 86 90 91  Resp: 16  16 17   Temp: (!) 97.5 F (36.4 C) 98 F (36.7 C) 98.6 F (37 C) 97.8 F (36.6 C)  TempSrc: Oral   Oral  SpO2: 98% 97% 99%  99%  Weight:      Height:       Body mass index is 23.23 kg/m.   General: Alert awake, not in obvious distress HENT: pupils equally reacting to light,  No scleral pallor or icterus noted. Oral mucosa is moist.  Chest:  Clear breath sounds.   No crackles or wheezes.  CVS: S1 &S2 heard. No murmur.  Regular rate and rhythm. Abdomen: Soft, nontender, nondistended.  Bowel sounds are heard.   Extremities: No cyanosis, clubbing or edema.  Peripheral pulses are palpable. Psych: Alert, awake and oriented, normal mood CNS:  No cranial nerve deficits.  Power equal in all extremities.   Skin: Warm and dry.  No rashes noted.  The results of significant diagnostics from this hospitalization (including imaging, microbiology, ancillary and laboratory) are listed below for reference.     Diagnostic Studies:   CT ANGIO GI BLEED  Result Date: 12/19/2022 CLINICAL DATA:  Lower GI bleed. EXAM: CTA ABDOMEN AND PELVIS WITHOUT AND WITH CONTRAST TECHNIQUE: Multidetector CT imaging of the abdomen and pelvis was performed using the standard protocol during bolus administration of intravenous contrast. Multiplanar reconstructed images and MIPs were obtained and reviewed to evaluate the vascular anatomy. RADIATION DOSE REDUCTION: This exam was performed according to the departmental dose-optimization program which includes automated exposure control, adjustment of the mA and/or kV according to patient size and/or use of iterative reconstruction technique. CONTRAST:  75mL OMNIPAQUE IOHEXOL 350 MG/ML SOLN COMPARISON:  CT a abdomen and pelvis 12/10/2022 FINDINGS: VASCULAR Aorta: Normal caliber aorta without aneurysm, dissection, vasculitis or significant stenosis. Celiac: Patent without evidence of aneurysm, dissection, vasculitis or significant stenosis. SMA: Patent without evidence of aneurysm, dissection, vasculitis or significant stenosis. Renals: Both renal arteries are patent without evidence of aneurysm, dissection,  vasculitis, fibromuscular dysplasia or significant stenosis. IMA: Patent without evidence of aneurysm, dissection, vasculitis or significant stenosis. Inflow: Patent without evidence of aneurysm, dissection, vasculitis or significant stenosis. Proximal Outflow: Bilateral common femoral and visualized portions of the superficial and profunda femoral arteries are patent without evidence of aneurysm, dissection, vasculitis or significant stenosis. Veins: No obvious venous abnormality. Review of the MIP images confirms the above findings. NON-VASCULAR Lower chest: The lung bases are clear without focal nodule, mass, or airspace disease. Heart size is normal. No significant pleural or pericardial effusion is present. Hepatobiliary: No focal liver abnormality is seen. No gallstones, gallbladder wall thickening, or biliary dilatation. Pancreas: Unremarkable. No pancreatic ductal dilatation or surrounding inflammatory changes. Spleen: Normal in size without focal abnormality. Adrenals/Urinary Tract: Adrenal glands are unremarkable. Kidneys are normal, without renal calculi, focal lesion, or hydronephrosis. Bladder is unremarkable. Stomach/Bowel: Stomach and duodenum are within. Progressive diffuse wall thickening is present now throughout nearly the entire colon. The proximal colon and cecum are spared. There is some adjacent fat stranding. No free air free fluid is present. Intraluminal contrast is present on arterial or delayed venous imaging. Lymphatic: Significant abdominal or pelvic adenopathy present. Reproductive:  Status post hysterectomy. No adnexal masses. Other: No abdominal wall hernia or abnormality. No abdominopelvic ascites. Musculoskeletal: The vertebral body heights and alignment are normal. Mild rightward curvature is present. No focal osseous lesions are present. The bony pelvis is normal. Hips are located and within normal limits. IMPRESSION: 1. Progressive diffuse wall thickening now throughout nearly  the entire colon with some adjacent fat stranding. Findings are consistent with an infectious or inflammatory colitis. This is likely the source lower GI bleed. 2. No evidence for active gastrointestinal bleeding. 3. Normal and unchanged appearance of the abdominal aorta and branch vessels as described. Electronically Signed   By: Marin Roberts M.D.   On: 12/19/2022 13:12   DG Chest 2 View  Result Date: 12/19/2022 CLINICAL DATA:  Shortness of breath EXAM: CHEST - 2 VIEW COMPARISON:  06/24/2018 FINDINGS: The heart size and mediastinal contours are within normal limits. Both lungs are clear. The visualized skeletal structures are unremarkable. IMPRESSION: No active cardiopulmonary disease. Electronically Signed   By: Duanne Guess D.O.   On: 12/19/2022 11:20     Labs:   Basic Metabolic Panel: Recent Labs  Lab 12/19/22 0939 12/21/22 0046 12/22/22 0308  NA 137 136 137  K 4.0 4.1 3.5  CL 101 100 103  CO2 25 26 25   GLUCOSE 96 102* 104*  BUN 11 6 <5*  CREATININE 1.05* 0.92 0.93  CALCIUM 9.0 8.6* 8.6*  MG  --  1.9 2.0   GFR Estimated Creatinine Clearance: 55.5 mL/min (by C-G formula based on SCr of 0.93 mg/dL). Liver Function Tests: Recent Labs  Lab 12/19/22 0939  AST 24  ALT 22  ALKPHOS 91  BILITOT 0.1*  PROT 6.9  ALBUMIN 3.5   No results for input(s): "LIPASE", "AMYLASE" in the last 168 hours. No results for input(s): "AMMONIA" in the last 168 hours. Coagulation profile No results for input(s): "INR", "PROTIME" in the last 168 hours.  CBC: Recent Labs  Lab 12/19/22 0939 12/20/22 0115 12/21/22 0046 12/22/22 0308  WBC 8.9  --  9.9 7.7  HGB 12.9 10.4* 11.1* 11.5*  HCT 39.7 31.8* 34.3* 33.9*  MCV 91.5  --  92.0 89.7  PLT 370  --  302 325   Cardiac Enzymes: No results for input(s): "CKTOTAL", "CKMB", "CKMBINDEX", "TROPONINI" in the last 168 hours. BNP: Invalid input(s): "POCBNP" CBG: No results for input(s): "GLUCAP" in the last 168 hours. D-Dimer No  results for input(s): "DDIMER" in the last 72 hours. Hgb A1c No results for input(s): "HGBA1C" in the last 72 hours. Lipid Profile No results for input(s): "CHOL", "HDL", "LDLCALC", "TRIG", "CHOLHDL", "LDLDIRECT" in the last 72 hours. Thyroid function studies No results for input(s): "TSH", "T4TOTAL", "T3FREE", "THYROIDAB" in the last 72 hours.  Invalid input(s): "FREET3" Anemia work up No results for input(s): "VITAMINB12", "FOLATE", "FERRITIN", "TIBC", "IRON", "RETICCTPCT" in the last 72 hours. Microbiology Recent Results (from the past 240 hour(s))  C Difficile Quick Screen w PCR reflex     Status: None   Collection Time: 12/20/22  1:40 AM   Specimen: Urine, Clean Catch; Stool  Result Value Ref Range Status   C Diff antigen NEGATIVE NEGATIVE Final   C Diff toxin NEGATIVE NEGATIVE Final   C Diff interpretation No C. difficile detected.  Final    Comment: Performed at [facility], [address] [place], [place] [address]  Gastrointestinal Panel by PCR , Stool     Status: None   Collection Time: 12/20/22  1:40 AM  Specimen: Urine, Clean Catch; Stool  Result Value Ref Range Status   Campylobacter species NOT DETECTED NOT DETECTED Final   Plesimonas shigelloides NOT DETECTED NOT DETECTED Final   Salmonella species NOT DETECTED NOT DETECTED Final   Yersinia enterocolitica NOT DETECTED NOT DETECTED Final   Vibrio species NOT DETECTED NOT DETECTED Final   Vibrio cholerae NOT DETECTED NOT DETECTED Final   Enteroaggregative E coli (EAEC) NOT DETECTED NOT DETECTED Final   Enteropathogenic E coli (EPEC) NOT DETECTED NOT DETECTED Final   Enterotoxigenic E coli (ETEC) NOT DETECTED NOT DETECTED Final   Shiga like toxin producing E coli (STEC) NOT DETECTED NOT DETECTED Final   Shigella/Enteroinvasive E coli (EIEC) NOT DETECTED NOT DETECTED Final   Cryptosporidium NOT DETECTED NOT DETECTED Final   Cyclospora cayetanensis NOT DETECTED NOT DETECTED Final   Entamoeba histolytica NOT  DETECTED NOT DETECTED Final   Giardia lamblia NOT DETECTED NOT DETECTED Final   Adenovirus F40/41 NOT DETECTED NOT DETECTED Final   Astrovirus NOT DETECTED NOT DETECTED Final   Norovirus GI/GII NOT DETECTED NOT DETECTED Final   Rotavirus A NOT DETECTED NOT DETECTED Final   Sapovirus (I, II, IV, and V) NOT DETECTED NOT DETECTED Final    Comment: Performed at [facility], [address] [place], [place] [address]     Discharge Instructions:   Discharge Instructions     Call MD for:  persistant nausea and vomiting   Complete by: As directed    Call MD for:  severe uncontrolled pain   Complete by: As directed    Call MD for:  temperature >100.4   Complete by: As directed    Diet general   Complete by: As directed    Discharge instructions   Complete by: As directed    Follow-up with Dr. Loreta Ave GI in 1 week.  Continue to take prednisone and medications as prescribed.  Seek medical attention for worsening symptoms.   Increase activity slowly   Complete by: As directed       Allergies as of 12/23/2022   No Known Allergies      Medication List     STOP taking these medications    amoxicillin-clavulanate 875-125 MG tablet Commonly known as: AUGMENTIN   ciprofloxacin 250 MG tablet Commonly known as: Cipro   ibuprofen 800 MG tablet Commonly known as: ADVIL       TAKE these medications    ALPRAZolam 0.5 MG tablet Commonly known as: XANAX TAKE 1-2 TABLETS BY MOUTH AT BEDTIME AS NEEDED What changed: when to take this   amphetamine-dextroamphetamine 30 MG tablet Commonly known as: Adderall Take 1/2 tablet by mouth 2 (two) times daily. What changed:  how much to take when to take this reasons to take this   buPROPion 150 MG 24 hr tablet Commonly known as: WELLBUTRIN XL TAKE 3 TABLETS BY MOUTH DAILY What changed: when to take this   gabapentin 100 MG capsule Commonly known as: NEURONTIN TAKE 2 CAPSULES BY MOUTH AT BEDTIME   lisdexamfetamine 70 MG  capsule Commonly known as: VYVANSE Take 1 capsule (70 mg total) by mouth daily. What changed: when to take this   mesalamine 1.2 g EC tablet Commonly known as: LIALDA Take 4 tablets (4.8 g total) by mouth daily with breakfast. Start taking on: Dec 24, 2022   PARoxetine 10 MG tablet Commonly known as: PAXIL Take 5 mg by mouth See admin instructions. Take 5 mg by mouth at bedtime and wean as directed   predniSONE  10 MG tablet Commonly known as: DELTASONE Take 4 tablets (40 mg total) by mouth daily with breakfast.   rizatriptan 10 MG tablet Commonly known as: MAXALT TAKE 1 TABLET BY MOUTH AND MAY REPEAT IN 2 HOURS AS NEEDED AS DIRECTED What changed:  how much to take how to take this when to take this additional instructions   Tylenol 325 MG tablet Generic drug: acetaminophen Take 325-650 mg by mouth every 6 (six) hours as needed for mild pain or headache.        Follow-up Information     Baxley, Luanna Cole, MD Follow up in 1 week(s).   Specialty: Internal Medicine Contact information: [address] [place] [address] [phone]         Charna Elizabeth, MD. Go in 1 week(s).   Specialty: Gastroenterology Why: as scheduled Contact information: [address], [place] [address] [phone]                  Time coordinating discharge: 39 minutes  Signed:  Alanny Rivers  [facility] 12/23/2022, 10:56 AM

## 2022-12-23 NOTE — TOC Transition Note (Signed)
Transition of Care [facility]) - CM/SW Discharge Note   Patient Details  Name: Molly Brady MRN: [id number] Date of Birth: May 30, 1961  Transition of [facility]) CM/SW Contact:  Kermit Balo, RN Phone Number: 12/23/2022, 10:18 AM   Clinical Narrative:    Pt is discharging home with self care. No needs per TOC.   Final next level of care: Home/Self Care Barriers to Discharge: No Barriers Identified   Patient Goals and CMS Choice      Discharge Placement                         Discharge Plan and Services Additional resources added to the After Visit Summary for                                       Social Determinants of Health (SDOH) Interventions SDOH Screenings   Food Insecurity: No Food Insecurity (12/19/2022)  Housing: Low Risk  (12/19/2022)  Transportation Needs: No Transportation Needs (12/19/2022)  Utilities: Not At Risk (12/19/2022)  Depression (PHQ2-9): Low Risk  (12/17/2022)  Tobacco Use: Low Risk  (12/22/2022)     Readmission Risk Interventions     No data to display

## 2022-12-23 NOTE — Plan of Care (Signed)
  Problem: Education: Goal: Knowledge of General Education information will improve Description: Including pain rating scale, medication(s)/side effects and non-pharmacologic comfort measures 12/23/2022 1402 by Letta Moynahan, RN Outcome: Adequate for Discharge 12/23/2022 0753 by Letta Moynahan, RN Outcome: Adequate for Discharge   Problem: Health Behavior/Discharge Planning: Goal: Ability to manage health-related needs will improve 12/23/2022 1402 by Letta Moynahan, RN Outcome: Adequate for Discharge 12/23/2022 0753 by Letta Moynahan, RN Outcome: Adequate for Discharge   Problem: Clinical Measurements: Goal: Ability to maintain clinical measurements within normal limits will improve 12/23/2022 1402 by Letta Moynahan, RN Outcome: Adequate for Discharge 12/23/2022 0753 by Letta Moynahan, RN Outcome: Adequate for Discharge Goal: Will remain free from infection 12/23/2022 1402 by Letta Moynahan, RN Outcome: Adequate for Discharge 12/23/2022 0753 by Letta Moynahan, RN Outcome: Adequate for Discharge Goal: Diagnostic test results will improve 12/23/2022 1402 by Letta Moynahan, RN Outcome: Adequate for Discharge 12/23/2022 0753 by Letta Moynahan, RN Outcome: Adequate for Discharge Goal: Respiratory complications will improve 12/23/2022 1402 by Letta Moynahan, RN Outcome: Adequate for Discharge 12/23/2022 0753 by Letta Moynahan, RN Outcome: Adequate for Discharge Goal: Cardiovascular complication will be avoided 12/23/2022 1402 by Letta Moynahan, RN Outcome: Adequate for Discharge 12/23/2022 0753 by Letta Moynahan, RN Outcome: Adequate for Discharge   Problem: Activity: Goal: Risk for activity intolerance will decrease 12/23/2022 1402 by Letta Moynahan, RN Outcome: Adequate for Discharge 12/23/2022 0753 by Letta Moynahan, RN Outcome: Adequate for Discharge   Problem: Nutrition: Goal: Adequate nutrition will be maintained 12/23/2022 1402 by Letta Moynahan, RN Outcome: Adequate for  Discharge 12/23/2022 0753 by Letta Moynahan, RN Outcome: Adequate for Discharge   Problem: Coping: Goal: Level of anxiety will decrease 12/23/2022 1402 by Letta Moynahan, RN Outcome: Adequate for Discharge 12/23/2022 0753 by Letta Moynahan, RN Outcome: Adequate for Discharge   Problem: Elimination: Goal: Will not experience complications related to bowel motility 12/23/2022 1402 by Letta Moynahan, RN Outcome: Adequate for Discharge 12/23/2022 0753 by Letta Moynahan, RN Outcome: Adequate for Discharge Goal: Will not experience complications related to urinary retention 12/23/2022 1402 by Letta Moynahan, RN Outcome: Adequate for Discharge 12/23/2022 0753 by Letta Moynahan, RN Outcome: Adequate for Discharge   Problem: Pain Managment: Goal: General experience of comfort will improve 12/23/2022 1402 by Letta Moynahan, RN Outcome: Adequate for Discharge 12/23/2022 0753 by Letta Moynahan, RN Outcome: Adequate for Discharge   Problem: Safety: Goal: Ability to remain free from injury will improve 12/23/2022 1402 by Letta Moynahan, RN Outcome: Adequate for Discharge 12/23/2022 0753 by Letta Moynahan, RN Outcome: Adequate for Discharge   Problem: Skin Integrity: Goal: Risk for impaired skin integrity will decrease 12/23/2022 1402 by Letta Moynahan, RN Outcome: Adequate for Discharge 12/23/2022 0753 by Letta Moynahan, RN Outcome: Adequate for Discharge

## 2022-12-23 NOTE — Progress Notes (Signed)
Subjective: No complaints.  Feeling well.  She did not have diarrhea after eating a very small dinner last night.  Objective: Vital signs in last 24 hours: Temp:  [96.5 F (35.8 C)-98.6 F (37 C)] 98.6 F (37 C) (05/22 0526) Pulse Rate:  [74-93] 90 (05/22 0526) Resp:  [12-20] 16 (05/22 0526) BP: (102-137)/(50-84) 137/84 (05/22 0526) SpO2:  [91 %-100 %] 99 % (05/22 0526) Weight:  [61.4 kg] 61.4 kg (05/21 1319) Last BM Date : 12/22/22  Intake/Output from previous day: 05/21 0701 - 05/22 0700 In: 150 [I.V.:150] Out: -  Intake/Output this shift: No intake/output data recorded.  General appearance: alert and no distress GI: soft, non-tender; bowel sounds normal; no masses,  no organomegaly  Lab Results: Recent Labs    12/21/22 0046 12/22/22 0308  WBC 9.9 7.7  HGB 11.1* 11.5*  HCT 34.3* 33.9*  PLT 302 325   BMET Recent Labs    12/21/22 0046 12/22/22 0308  NA 136 137  K 4.1 3.5  CL 100 103  CO2 26 25  GLUCOSE 102* 104*  BUN 6 <5*  CREATININE 0.92 0.93  CALCIUM 8.6* 8.6*   LFT No results for input(s): "PROT", "ALBUMIN", "AST", "ALT", "ALKPHOS", "BILITOT", "BILIDIR", "IBILI" in the last 72 hours. PT/INR No results for input(s): "LABPROT", "INR" in the last 72 hours. Hepatitis Panel No results for input(s): "HEPBSAG", "HCVAB", "HEPAIGM", "HEPBIGM" in the last 72 hours. C-Diff No results for input(s): "CDIFFTOX" in the last 72 hours. Fecal Lactopherrin No results for input(s): "FECLLACTOFRN" in the last 72 hours.  Studies/Results: No results found.  Medications: Scheduled:  buPROPion  450 mg Oral Daily   gabapentin  200 mg Oral Daily   lisdexamfetamine  70 mg Oral Daily   mesalamine  4.8 g Oral Q breakfast   methylPREDNISolone (SOLU-MEDROL) injection  40 mg Intravenous Q12H   PARoxetine  5 mg Oral Daily   polyethylene glycol-electrolytes  4,000 mL Oral Once   Continuous:  sodium chloride 75 mL/hr at 12/23/22 0546    Assessment/Plan: 1) Presumed pan  UC.   Clinically she is well.  She received a total of 2 doses of Solumedrol.  Because of personal pressing issues, she can be discharged earlier.  The preference is to keep her in the hospital until there is confirmed improvement with the steroids, however, she appears well.  No side effects from the Solumedrol.  Plan: 1) Discharge after the next dosing of Solumedrol, which will be around 4 PM. 2) Discharge with prednisone 40 mg QD, to be dispensed in 10 mg tablets.  Provide her with 120 tabs to cover the taper regimen, which will be instituted as an outpatient. 3) Arrangements will be made for her to start on a biologic as an outpatient once biopsy confirmation is received. 4) Discharge home with Lialda 4.8 grams/day. 5) Follow up with Dr. Loreta Ave in 1 week upon discharge.  LOS: 4 days   Jameka Ivie D 12/23/2022, 7:09 AM

## 2022-12-23 NOTE — Plan of Care (Signed)
Pt has denied pain. Up ambulating in room this am. Pt reported BM 5/21 and stated no blood noted.  Problem: Education: Goal: Knowledge of General Education information will improve Description: Including pain rating scale, medication(s)/side effects and non-pharmacologic comfort measures Outcome: Progressing   Problem: Health Behavior/Discharge Planning: Goal: Ability to manage health-related needs will improve Outcome: Progressing   Problem: Clinical Measurements: Goal: Ability to maintain clinical measurements within normal limits will improve Outcome: Progressing Goal: Will remain free from infection Outcome: Progressing Goal: Diagnostic test results will improve Outcome: Progressing Goal: Respiratory complications will improve Outcome: Progressing Goal: Cardiovascular complication will be avoided Outcome: Progressing   Problem: Activity: Goal: Risk for activity intolerance will decrease Outcome: Progressing   Problem: Nutrition: Goal: Adequate nutrition will be maintained Outcome: Progressing   Problem: Coping: Goal: Level of anxiety will decrease Outcome: Progressing   Problem: Elimination: Goal: Will not experience complications related to bowel motility Outcome: Progressing Goal: Will not experience complications related to urinary retention Outcome: Progressing   Problem: Pain Managment: Goal: General experience of comfort will improve Outcome: Progressing   Problem: Safety: Goal: Ability to remain free from injury will improve Outcome: Progressing   Problem: Skin Integrity: Goal: Risk for impaired skin integrity will decrease Outcome: Progressing

## 2022-12-23 NOTE — Plan of Care (Signed)

## 2022-12-24 ENCOUNTER — Other Ambulatory Visit: Payer: Self-pay | Admitting: Psychiatry

## 2022-12-24 ENCOUNTER — Other Ambulatory Visit (HOSPITAL_COMMUNITY): Payer: Self-pay

## 2022-12-24 ENCOUNTER — Telehealth: Payer: Self-pay | Admitting: Psychiatry

## 2022-12-24 ENCOUNTER — Telehealth: Payer: Self-pay | Admitting: Internal Medicine

## 2022-12-24 DIAGNOSIS — F900 Attention-deficit hyperactivity disorder, predominantly inattentive type: Secondary | ICD-10-CM

## 2022-12-24 MED ORDER — AMPHETAMINE-DEXTROAMPHETAMINE 30 MG PO TABS
15.0000 mg | ORAL_TABLET | Freq: Two times a day (BID) | ORAL | 0 refills | Status: DC
Start: 2022-12-24 — End: 2023-01-20
  Filled 2022-12-24: qty 30, 30d supply, fill #0

## 2022-12-24 MED ORDER — LISDEXAMFETAMINE DIMESYLATE 70 MG PO CAPS
70.0000 mg | ORAL_CAPSULE | Freq: Every day | ORAL | 0 refills | Status: DC
Start: 2022-12-24 — End: 2023-01-20
  Filled 2022-12-24: qty 30, 30d supply, fill #0

## 2022-12-24 NOTE — Telephone Encounter (Signed)
Called and spoke with Misty Stanley at [facility] office and she said Kamarie has hospital follow up with Dr Loreta Ave on 12/29/2022 and they will do the labs CBC, BMP, Magnesium that the hospital wanted and she will touch base with me next week. Dr Elnoria Howard did Esto procedure in the hospital and the pathology is due back today.

## 2022-12-24 NOTE — Telephone Encounter (Signed)
Pt called at 9:11a.  She is requesting refill of Vyvanse and Adderall to [facility].  She said do not send subsequent scripts because [employer] is terminating contract with [facility] at the end of this month.  Next appt 6/19

## 2022-12-25 ENCOUNTER — Encounter (HOSPITAL_COMMUNITY): Payer: Self-pay | Admitting: Gastroenterology

## 2022-12-29 DIAGNOSIS — F419 Anxiety disorder, unspecified: Secondary | ICD-10-CM | POA: Diagnosis not present

## 2022-12-29 DIAGNOSIS — K5100 Ulcerative (chronic) pancolitis without complications: Secondary | ICD-10-CM | POA: Diagnosis not present

## 2022-12-29 DIAGNOSIS — Z86010 Personal history of colonic polyps: Secondary | ICD-10-CM | POA: Diagnosis not present

## 2022-12-29 DIAGNOSIS — F32A Depression, unspecified: Secondary | ICD-10-CM | POA: Diagnosis not present

## 2023-01-20 ENCOUNTER — Encounter: Payer: Self-pay | Admitting: Psychiatry

## 2023-01-20 ENCOUNTER — Other Ambulatory Visit (HOSPITAL_COMMUNITY): Payer: Self-pay

## 2023-01-20 ENCOUNTER — Ambulatory Visit (INDEPENDENT_AMBULATORY_CARE_PROVIDER_SITE_OTHER): Payer: BC Managed Care – PPO | Admitting: Psychiatry

## 2023-01-20 DIAGNOSIS — F4001 Agoraphobia with panic disorder: Secondary | ICD-10-CM

## 2023-01-20 DIAGNOSIS — Z8669 Personal history of other diseases of the nervous system and sense organs: Secondary | ICD-10-CM

## 2023-01-20 DIAGNOSIS — F900 Attention-deficit hyperactivity disorder, predominantly inattentive type: Secondary | ICD-10-CM | POA: Diagnosis not present

## 2023-01-20 DIAGNOSIS — F3342 Major depressive disorder, recurrent, in full remission: Secondary | ICD-10-CM | POA: Diagnosis not present

## 2023-01-20 DIAGNOSIS — F411 Generalized anxiety disorder: Secondary | ICD-10-CM | POA: Diagnosis not present

## 2023-01-20 DIAGNOSIS — F419 Anxiety disorder, unspecified: Secondary | ICD-10-CM

## 2023-01-20 DIAGNOSIS — F5105 Insomnia due to other mental disorder: Secondary | ICD-10-CM

## 2023-01-20 MED ORDER — PAROXETINE HCL 10 MG PO TABS: 5.0000 mg | ORAL_TABLET | ORAL | 1 refills | Status: DC

## 2023-01-20 MED ORDER — ALPRAZOLAM 0.5 MG PO TABS
0.5000 mg | ORAL_TABLET | Freq: Every day | ORAL | 5 refills | Status: DC
Start: 2023-01-20 — End: 2023-07-22

## 2023-01-20 MED ORDER — GABAPENTIN 100 MG PO CAPS: 200.0000 mg | ORAL_CAPSULE | Freq: Two times a day (BID) | ORAL | 1 refills | Status: DC

## 2023-01-20 MED ORDER — LISDEXAMFETAMINE DIMESYLATE 70 MG PO CAPS
70.0000 mg | ORAL_CAPSULE | Freq: Every day | ORAL | 0 refills | Status: DC
Start: 2023-01-20 — End: 2023-07-22
  Filled 2023-01-20 – 2023-01-21 (×2): qty 30, 30d supply, fill #0

## 2023-01-20 MED ORDER — LISDEXAMFETAMINE DIMESYLATE 70 MG PO CAPS
70.0000 mg | ORAL_CAPSULE | Freq: Every day | ORAL | 0 refills | Status: DC
Start: 2023-03-17 — End: 2023-04-21
  Filled 2023-03-19: qty 30, 30d supply, fill #0

## 2023-01-20 MED ORDER — LISDEXAMFETAMINE DIMESYLATE 70 MG PO CAPS
70.0000 mg | ORAL_CAPSULE | Freq: Every day | ORAL | 0 refills | Status: DC
Start: 2023-02-17 — End: 2023-07-22
  Filled 2023-02-18: qty 30, 30d supply, fill #0

## 2023-01-20 MED ORDER — AMPHETAMINE-DEXTROAMPHETAMINE 30 MG PO TABS
15.0000 mg | ORAL_TABLET | Freq: Two times a day (BID) | ORAL | 0 refills | Status: DC
Start: 2023-01-20 — End: 2023-07-22
  Filled 2023-01-20 – 2023-01-21 (×2): qty 30, 30d supply, fill #0
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
## 2023-01-21 ENCOUNTER — Other Ambulatory Visit (HOSPITAL_COMMUNITY): Payer: Self-pay

## 2023-01-22 ENCOUNTER — Other Ambulatory Visit (HOSPITAL_COMMUNITY): Payer: Self-pay

## 2023-02-01 ENCOUNTER — Other Ambulatory Visit: Payer: Self-pay | Admitting: Psychiatry

## 2023-02-01 DIAGNOSIS — F5105 Insomnia due to other mental disorder: Secondary | ICD-10-CM

## 2023-02-01 DIAGNOSIS — F419 Anxiety disorder, unspecified: Secondary | ICD-10-CM

## 2023-02-15 ENCOUNTER — Telehealth: Payer: Self-pay | Admitting: Internal Medicine

## 2023-02-15 ENCOUNTER — Encounter: Payer: Self-pay | Admitting: Internal Medicine

## 2023-02-15 ENCOUNTER — Ambulatory Visit (INDEPENDENT_AMBULATORY_CARE_PROVIDER_SITE_OTHER): Payer: BC Managed Care – PPO | Admitting: Internal Medicine

## 2023-02-15 VITALS — BP 122/68 | HR 80 | Temp 98.0°F | Ht 64.0 in | Wt 136.4 lb

## 2023-02-15 DIAGNOSIS — R300 Dysuria: Secondary | ICD-10-CM

## 2023-02-15 DIAGNOSIS — N390 Urinary tract infection, site not specified: Secondary | ICD-10-CM | POA: Diagnosis not present

## 2023-02-15 LAB — POCT URINALYSIS DIP (CLINITEK)
Bilirubin, UA: NEGATIVE
Glucose, UA: NEGATIVE mg/dL
Ketones, POC UA: NEGATIVE mg/dL
Nitrite, UA: NEGATIVE
POC PROTEIN,UA: 30 — AB
Spec Grav, UA: >=1.03 — AB (ref 1.010–1.025)
Urobilinogen, UA: 0.2 E.U./dL
pH, UA: 5 (ref 5.0–8.0)

## 2023-02-15 MED ORDER — CIPROFLOXACIN HCL 250 MG PO TABS: 250.0000 mg | ORAL_TABLET | Freq: Two times a day (BID) | ORAL | 0 refills | Status: DC

## 2023-02-15 NOTE — Telephone Encounter (Signed)
Molly Brady [phone]  [name] called to say she has another UTI she thinks, burning, frequency, was going just a little at a time, she did get some cranberry juice so she is going a little more, just very uncomfortable. Schedule for appointment at 12:30

## 2023-02-15 NOTE — Telephone Encounter (Signed)
Appt scheduled today.  Dm/cma

## 2023-02-15 NOTE — Patient Instructions (Signed)
Patient has has follow-up appointment tomorrow with Dr. Loreta Ave.  Urine dipstick today is abnormal.  Urine culture obtained.  She will be started on Cipro 250 mg twice daily for 7 days pending culture results.  We will contact her with results and further instructions for follow-up.

## 2023-02-15 NOTE — Progress Notes (Signed)
Patient Care Team: Margaree Mackintosh, MD as PCP - General (Internal Medicine)  Visit Date: 02/15/23  Subjective:    Patient ID: Molly Brady , Female   DOB: [birth year], 62 y.o.    MRN: [id number]   62 y.o. Female presents today for urinary burning, frequency since 02/13/22. Took AZO yesterday and has been drinking cranberry juice. Denies fever, chills, nausea, vomiting. 12/22/22 colonoscopy showed pancolitis, inflammation from anus to cecum graded as Mayo Score 3. She has frequent diarrhea. 12/10/22 .  In May, urine culture positive for Citrobacter freundii. She was treated with Cipro and improved. Hx of ulcerative pancolitis followed by Dr. Loreta Ave. Treated in May with prednisone and improved.  Past Medical History:  Diagnosis Date   ADHD    Anemia    none recent   Anxiety and depression    Elevated liver enzymes 2019   resolved 2019 related to ibuprofen use   Endometrial polyp    bleeding/spotting x  2-3 months   Migraines      Family History  Problem Relation Age of Onset   Lung cancer Mother    Asthma Mother    Lung cancer Father    Asthma Sister    Breast cancer Maternal Aunt    Breast cancer Maternal Grandmother     Social History   Social History Narrative   Not on file      Review of Systems  Constitutional:  Negative for chills, fever and malaise/fatigue.  HENT:  Negative for congestion.   Eyes:  Negative for blurred vision.  Respiratory:  Negative for cough and shortness of breath.   Cardiovascular:  Negative for chest pain, palpitations and leg swelling.  Gastrointestinal:  Negative for nausea and vomiting.  Genitourinary:  Positive for dysuria and frequency.  Musculoskeletal:  Negative for back pain.  Skin:  Negative for rash.  Neurological:  Negative for loss of consciousness and headaches.        Objective:   Vitals: BP 122/68   Pulse 80   Temp 98 F (36.7 C) (Temporal)   Ht 5\' 4"  (1.626 m)   Wt 136 lb 6.4 oz (61.9 kg)   LMP 08/03/2014   SpO2  99%   BMI 23.41 kg/m    Physical Exam Vitals and nursing note reviewed.  Constitutional:      General: She is not in acute distress.    Appearance: Normal appearance. She is not toxic-appearing.  HENT:     Head: Normocephalic and atraumatic.  Pulmonary:     Effort: Pulmonary effort is normal.  Skin:    General: Skin is warm and dry.  Neurological:     Mental Status: She is alert and oriented to person, place, and time. Mental status is at baseline.  Psychiatric:        Mood and Affect: Mood normal.        Behavior: Behavior normal.        Thought Content: Thought content normal.        Judgment: Judgment normal.       Results:   Studies obtained and personally reviewed by me:   Labs:       Component Value Date/Time   NA 137 12/22/2022 0308   K 3.5 12/22/2022 0308   CL 103 12/22/2022 0308   CO2 25 12/22/2022 0308   GLUCOSE 104 (H) 12/22/2022 0308   BUN <5 (L) 12/22/2022 0308   CREATININE 0.93 12/22/2022 0308   CREATININE 0.91 02/12/2020 1017  CALCIUM 8.6 (L) 12/22/2022 0308   PROT 6.9 12/19/2022 0939   ALBUMIN 3.5 12/19/2022 0939   AST 24 12/19/2022 0939   ALT 22 12/19/2022 0939   ALKPHOS 91 12/19/2022 0939   BILITOT 0.1 (L) 12/19/2022 0939   GFRNONAA >60 12/22/2022 0308   GFRNONAA 70 02/12/2020 1017   GFRAA 81 02/12/2020 1017     Lab Results  Component Value Date   WBC 7.7 12/22/2022   HGB 11.5 (L) 12/22/2022   HCT 33.9 (L) 12/22/2022   MCV 89.7 12/22/2022   PLT 325 12/22/2022    Lab Results  Component Value Date   CHOL 258 (H) 02/12/2020   HDL 78 02/12/2020   LDLCALC 161 (H) 02/12/2020   TRIG 84 02/12/2020   CHOLHDL 3.3 02/12/2020    No results found for: "HGBA1C"   Lab Results  Component Value Date   TSH 1.36 02/12/2020      Assessment & Plan:   Probable UTI: prescribed ciprofloxacin 250 mg twice daily for seven days. Ordered urine culture. Will contact with results.    I,Alexander Ruley,acting as a Neurosurgeon for Margaree Mackintosh,  MD.,have documented all relevant documentation on the behalf of Margaree Mackintosh, MD,as directed by  Margaree Mackintosh, MD while in the presence of Margaree Mackintosh, MD.   I, Margaree Mackintosh, MD, have reviewed all documentation for this visit. The documentation on 02/15/23 for the exam, diagnosis, procedures, and orders are all accurate and complete.

## 2023-02-16 ENCOUNTER — Other Ambulatory Visit (HOSPITAL_COMMUNITY): Payer: Self-pay

## 2023-02-16 DIAGNOSIS — F32A Depression, unspecified: Secondary | ICD-10-CM | POA: Diagnosis not present

## 2023-02-16 DIAGNOSIS — H5203 Hypermetropia, bilateral: Secondary | ICD-10-CM | POA: Diagnosis not present

## 2023-02-16 DIAGNOSIS — Z86010 Personal history of colonic polyps: Secondary | ICD-10-CM | POA: Diagnosis not present

## 2023-02-16 DIAGNOSIS — H40033 Anatomical narrow angle, bilateral: Secondary | ICD-10-CM | POA: Diagnosis not present

## 2023-02-16 DIAGNOSIS — F419 Anxiety disorder, unspecified: Secondary | ICD-10-CM | POA: Diagnosis not present

## 2023-02-16 DIAGNOSIS — K5100 Ulcerative (chronic) pancolitis without complications: Secondary | ICD-10-CM | POA: Diagnosis not present

## 2023-02-16 MED ORDER — PREDNISONE 10 MG PO TABS
ORAL_TABLET | ORAL | 0 refills | Status: AC
  Filled 2023-02-16: qty 53, 25d supply, fill #0

## 2023-02-17 LAB — URINE CULTURE
MICRO NUMBER:: 15200031
SPECIMEN QUALITY:: ADEQUATE

## 2023-02-18 ENCOUNTER — Other Ambulatory Visit: Payer: Self-pay

## 2023-02-18 ENCOUNTER — Other Ambulatory Visit: Payer: Self-pay | Admitting: Psychiatry

## 2023-02-18 ENCOUNTER — Other Ambulatory Visit (HOSPITAL_COMMUNITY): Payer: Self-pay

## 2023-02-18 DIAGNOSIS — Z8669 Personal history of other diseases of the nervous system and sense organs: Secondary | ICD-10-CM

## 2023-02-22 NOTE — Progress Notes (Signed)
Patient Care Team: Margaree Mackintosh, MD as PCP - General (Internal Medicine)  Visit Date: 02/23/23  Subjective:    Patient ID: Molly Brady , Female   DOB: 29-Oct-1960, 62 y.o.    MRN: [id number]   62 y.o. Female presents today for follow up of UTI.   We had prescribed ciprofloxacin 250 mg at her last visit 02/15/23. She did tolerate the ciprofloxacin well and completed her 7-day course. However, she continues to experience mild dysuria although not as severe as it was last week. Also notes having some urinary retention. She began feeling a little better yesterday.   Past Medical History:  Diagnosis Date   ADHD    Anemia    none recent   Anxiety and depression    Elevated liver enzymes 2019   resolved 2019 related to ibuprofen use   Endometrial polyp    bleeding/spotting x  2-3 months   Migraines      Family History  Problem Relation Age of Onset   Lung cancer Mother    Asthma Mother    Lung cancer Father    Asthma Sister    Breast cancer Maternal Aunt    Breast cancer Maternal Grandmother     Social History   Social History Narrative   Not on file      Review of Systems  Constitutional:  Negative for fever and malaise/fatigue.  HENT:  Negative for congestion.   Eyes:  Negative for blurred vision.  Respiratory:  Negative for cough and shortness of breath.   Cardiovascular:  Negative for chest pain, palpitations and leg swelling.  Gastrointestinal:  Negative for vomiting.  Genitourinary:  Positive for dysuria (Mild).       (+) Urinary retention  Musculoskeletal:  Negative for back pain.  Skin:  Negative for rash.  Neurological:  Negative for loss of consciousness and headaches.        Objective:   Vitals: BP 120/78 (BP Location: Left Arm, Patient Position: Sitting, Cuff Size: Large)   Pulse (!) 103   Temp 98.3 F (36.8 C) (Tympanic)   Resp 14   Wt 136 lb (61.7 kg)   LMP 08/03/2014   SpO2 98%   BMI 23.34 kg/m    Physical Exam Vitals and  nursing note reviewed.  Constitutional:      General: She is not in acute distress.    Appearance: Normal appearance. She is not toxic-appearing.  HENT:     Head: Normocephalic and atraumatic.  Pulmonary:     Effort: Pulmonary effort is normal.  Skin:    General: Skin is warm and dry.  Neurological:     Mental Status: She is alert and oriented to person, place, and time. Mental status is at baseline.  Psychiatric:        Mood and Affect: Mood normal.        Behavior: Behavior normal.        Thought Content: Thought content normal.        Judgment: Judgment normal.       Results:   Studies obtained and personally reviewed by me:   Labs:       Component Value Date/Time   NA 137 12/22/2022 0308   K 3.5 12/22/2022 0308   CL 103 12/22/2022 0308   CO2 25 12/22/2022 0308   GLUCOSE 104 (H) 12/22/2022 0308   BUN <5 (L) 12/22/2022 0308   CREATININE 0.93 12/22/2022 0308   CREATININE 0.91 02/12/2020 1017   CALCIUM  8.6 (L) 12/22/2022 0308   PROT 6.9 12/19/2022 0939   ALBUMIN 3.5 12/19/2022 0939   AST 24 12/19/2022 0939   ALT 22 12/19/2022 0939   ALKPHOS 91 12/19/2022 0939   BILITOT 0.1 (L) 12/19/2022 0939   GFRNONAA >60 12/22/2022 0308   GFRNONAA 70 02/12/2020 1017   GFRAA 81 02/12/2020 1017     Lab Results  Component Value Date   WBC 7.7 12/22/2022   HGB 11.5 (L) 12/22/2022   HCT 33.9 (L) 12/22/2022   MCV 89.7 12/22/2022   PLT 325 12/22/2022    Lab Results  Component Value Date   CHOL 258 (H) 02/12/2020   HDL 78 02/12/2020   LDLCALC 161 (H) 02/12/2020   TRIG 84 02/12/2020   CHOLHDL 3.3 02/12/2020    No results found for: "HGBA1C"   Lab Results  Component Value Date   TSH 1.36 02/12/2020     Assessment & Plan:   UTI:  She was last seen 02/15/2023 and prescribed ciprofloxacin 250 mg twice daily for seven days. Urine culture was positive for E. coli which is sensitive to Cipro. She continues to complain of some dysuria. Follow-up urinalysis today was  positive for trace leukocytes.   We will prescribe 500 mg ciprofloxacin to take twice daily for 7 days. If she develops nausea, she may take 1/2 tablet twice daily.  She will be out of town next week. Plan to return in 2 weeks for repeat urinalysis and follow-up visit.   I,Mathew Stumpf,acting as a Neurosurgeon for Margaree Mackintosh, MD.,have documented all relevant documentation on the behalf of Margaree Mackintosh, MD,as directed by  Margaree Mackintosh, MD while in the presence of Margaree Mackintosh, MD.   I, Margaree Mackintosh, MD, have reviewed all documentation for this visit. The documentation on 02/23/23 for the exam, diagnosis, procedures, and orders are all accurate and complete.

## 2023-02-22 NOTE — Telephone Encounter (Signed)
scheduled

## 2023-02-22 NOTE — Telephone Encounter (Signed)
Molly Brady has called to say she finished her medicine today and she still has a UTI.

## 2023-02-23 ENCOUNTER — Ambulatory Visit (INDEPENDENT_AMBULATORY_CARE_PROVIDER_SITE_OTHER): Payer: BC Managed Care – PPO | Admitting: Internal Medicine

## 2023-02-23 ENCOUNTER — Encounter: Payer: Self-pay | Admitting: Internal Medicine

## 2023-02-23 VITALS — BP 120/78 | HR 103 | Temp 98.3°F | Resp 14 | Wt 136.0 lb

## 2023-02-23 DIAGNOSIS — R300 Dysuria: Secondary | ICD-10-CM | POA: Diagnosis not present

## 2023-02-23 DIAGNOSIS — R399 Unspecified symptoms and signs involving the genitourinary system: Secondary | ICD-10-CM | POA: Diagnosis not present

## 2023-02-23 DIAGNOSIS — B9620 Unspecified Escherichia coli [E. coli] as the cause of diseases classified elsewhere: Secondary | ICD-10-CM

## 2023-02-23 DIAGNOSIS — N390 Urinary tract infection, site not specified: Secondary | ICD-10-CM

## 2023-02-23 LAB — POC URINALSYSI DIPSTICK (AUTOMATED)
Bilirubin, UA: NEGATIVE
Blood, UA: NEGATIVE
Glucose, UA: NEGATIVE
Ketones, UA: NEGATIVE
Nitrite, UA: NEGATIVE
Protein, UA: NEGATIVE
Spec Grav, UA: <=1.005 — AB (ref 1.010–1.025)
Urobilinogen, UA: 0.2 E.U./dL
pH, UA: 6 (ref 5.0–8.0)

## 2023-02-23 MED ORDER — CIPROFLOXACIN HCL 500 MG PO TABS: 500.0000 mg | ORAL_TABLET | Freq: Two times a day (BID) | ORAL | 0 refills | Status: DC

## 2023-02-23 NOTE — Patient Instructions (Addendum)
Patient will be treated with Cipro 500 mg twice daily for an additional 7 days and will return when she is back in town from vacation on August 5 for nurse visit and urine dipstick.

## 2023-03-08 ENCOUNTER — Ambulatory Visit (INDEPENDENT_AMBULATORY_CARE_PROVIDER_SITE_OTHER): Payer: BC Managed Care – PPO | Admitting: Internal Medicine

## 2023-03-08 DIAGNOSIS — K5100 Ulcerative (chronic) pancolitis without complications: Secondary | ICD-10-CM | POA: Diagnosis not present

## 2023-03-08 DIAGNOSIS — B9620 Unspecified Escherichia coli [E. coli] as the cause of diseases classified elsewhere: Secondary | ICD-10-CM

## 2023-03-08 DIAGNOSIS — R399 Unspecified symptoms and signs involving the genitourinary system: Secondary | ICD-10-CM

## 2023-03-08 DIAGNOSIS — N390 Urinary tract infection, site not specified: Secondary | ICD-10-CM

## 2023-03-08 LAB — POCT URINALYSIS DIPSTICK
Bilirubin, UA: NEGATIVE
Blood, UA: NEGATIVE
Glucose, UA: NEGATIVE
Leukocytes, UA: NEGATIVE
Nitrite, UA: NEGATIVE
Protein, UA: NEGATIVE
Spec Grav, UA: <=1.005 — AB (ref 1.010–1.025)
Urobilinogen, UA: 0.2 E.U./dL
pH, UA: 6 (ref 5.0–8.0)

## 2023-03-08 NOTE — Progress Notes (Signed)
Patient is here for recheck on urine. Urine sent to provider for review.

## 2023-03-08 NOTE — Patient Instructions (Signed)
E.coli UTI resolved. Nurse visit only.

## 2023-03-12 ENCOUNTER — Other Ambulatory Visit: Payer: Self-pay | Admitting: Psychiatry

## 2023-03-12 DIAGNOSIS — F900 Attention-deficit hyperactivity disorder, predominantly inattentive type: Secondary | ICD-10-CM

## 2023-03-12 DIAGNOSIS — F3342 Major depressive disorder, recurrent, in full remission: Secondary | ICD-10-CM

## 2023-03-19 ENCOUNTER — Other Ambulatory Visit (HOSPITAL_COMMUNITY): Payer: Self-pay

## 2023-03-22 ENCOUNTER — Other Ambulatory Visit (HOSPITAL_COMMUNITY): Payer: Self-pay

## 2023-03-23 ENCOUNTER — Other Ambulatory Visit (HOSPITAL_COMMUNITY): Payer: Self-pay

## 2023-03-23 MED ORDER — MESALAMINE 1.2 G PO TBEC
2.4000 g | DELAYED_RELEASE_TABLET | Freq: Two times a day (BID) | ORAL | 3 refills | Status: DC
  Filled 2023-03-23: qty 360, 90d supply, fill #0
  Filled 2023-06-16: qty 360, 90d supply, fill #1
  Filled 2023-09-15: qty 360, 90d supply, fill #2
  Filled 2023-12-14: qty 360, 90d supply, fill #3

## 2023-04-21 ENCOUNTER — Other Ambulatory Visit (HOSPITAL_COMMUNITY): Payer: Self-pay

## 2023-04-21 ENCOUNTER — Other Ambulatory Visit: Payer: Self-pay | Admitting: Psychiatry

## 2023-04-21 DIAGNOSIS — F900 Attention-deficit hyperactivity disorder, predominantly inattentive type: Secondary | ICD-10-CM

## 2023-04-21 MED ORDER — LISDEXAMFETAMINE DIMESYLATE 70 MG PO CAPS
70.0000 mg | ORAL_CAPSULE | Freq: Every day | ORAL | 0 refills | Status: DC
Start: 2023-04-21 — End: 2023-05-20
  Filled 2023-04-21: qty 30, 30d supply, fill #0

## 2023-04-22 ENCOUNTER — Other Ambulatory Visit (HOSPITAL_COMMUNITY): Payer: Self-pay

## 2023-05-20 ENCOUNTER — Other Ambulatory Visit: Payer: Self-pay | Admitting: Psychiatry

## 2023-05-20 ENCOUNTER — Other Ambulatory Visit (HOSPITAL_COMMUNITY): Payer: Self-pay

## 2023-05-20 DIAGNOSIS — F900 Attention-deficit hyperactivity disorder, predominantly inattentive type: Secondary | ICD-10-CM

## 2023-05-20 MED ORDER — LISDEXAMFETAMINE DIMESYLATE 70 MG PO CAPS
70.0000 mg | ORAL_CAPSULE | Freq: Every day | ORAL | 0 refills | Status: DC
Start: 2023-05-20 — End: 2023-06-16
  Filled 2023-05-20: qty 30, 30d supply, fill #0

## 2023-05-20 NOTE — Telephone Encounter (Signed)
LF 09/18; LV 06/19; NV 12/19

## 2023-05-26 DIAGNOSIS — Z08 Encounter for follow-up examination after completed treatment for malignant neoplasm: Secondary | ICD-10-CM | POA: Diagnosis not present

## 2023-05-26 DIAGNOSIS — Z8049 Family history of malignant neoplasm of other genital organs: Secondary | ICD-10-CM | POA: Diagnosis not present

## 2023-05-26 DIAGNOSIS — Z8542 Personal history of malignant neoplasm of other parts of uterus: Secondary | ICD-10-CM | POA: Diagnosis not present

## 2023-06-09 ENCOUNTER — Other Ambulatory Visit: Payer: Self-pay | Admitting: Psychiatry

## 2023-06-09 DIAGNOSIS — F3342 Major depressive disorder, recurrent, in full remission: Secondary | ICD-10-CM

## 2023-06-09 DIAGNOSIS — F900 Attention-deficit hyperactivity disorder, predominantly inattentive type: Secondary | ICD-10-CM

## 2023-06-09 NOTE — Telephone Encounter (Signed)
Lf 08/10; LV 06/19; NV 12/19

## 2023-06-16 ENCOUNTER — Other Ambulatory Visit (HOSPITAL_COMMUNITY): Payer: Self-pay

## 2023-06-16 ENCOUNTER — Other Ambulatory Visit: Payer: Self-pay | Admitting: Psychiatry

## 2023-06-16 DIAGNOSIS — F900 Attention-deficit hyperactivity disorder, predominantly inattentive type: Secondary | ICD-10-CM

## 2023-06-16 MED ORDER — LISDEXAMFETAMINE DIMESYLATE 70 MG PO CAPS
70.0000 mg | ORAL_CAPSULE | Freq: Every day | ORAL | 0 refills | Status: DC
Start: 2023-06-21 — End: 2023-07-19
  Filled 2023-06-21: qty 30, 30d supply, fill #0

## 2023-06-16 NOTE — Telephone Encounter (Signed)
Changed start date to 11/14; lf 10/17; LV 06/19; NV 12/19

## 2023-06-16 NOTE — Telephone Encounter (Signed)
Last dispensed date 10/21- changed pu date to 11/18

## 2023-06-18 ENCOUNTER — Other Ambulatory Visit (HOSPITAL_COMMUNITY): Payer: Self-pay

## 2023-06-21 ENCOUNTER — Other Ambulatory Visit: Payer: Self-pay

## 2023-06-21 ENCOUNTER — Other Ambulatory Visit (HOSPITAL_COMMUNITY): Payer: Self-pay

## 2023-07-06 ENCOUNTER — Other Ambulatory Visit: Payer: Self-pay | Admitting: Psychiatry

## 2023-07-06 DIAGNOSIS — Z8669 Personal history of other diseases of the nervous system and sense organs: Secondary | ICD-10-CM

## 2023-07-09 ENCOUNTER — Other Ambulatory Visit: Payer: Self-pay | Admitting: Psychiatry

## 2023-07-09 DIAGNOSIS — F4001 Agoraphobia with panic disorder: Secondary | ICD-10-CM

## 2023-07-15 DIAGNOSIS — R519 Headache, unspecified: Secondary | ICD-10-CM | POA: Diagnosis not present

## 2023-07-15 DIAGNOSIS — R0981 Nasal congestion: Secondary | ICD-10-CM | POA: Diagnosis not present

## 2023-07-19 ENCOUNTER — Other Ambulatory Visit (HOSPITAL_COMMUNITY): Payer: Self-pay

## 2023-07-19 ENCOUNTER — Other Ambulatory Visit: Payer: Self-pay | Admitting: Psychiatry

## 2023-07-19 DIAGNOSIS — F900 Attention-deficit hyperactivity disorder, predominantly inattentive type: Secondary | ICD-10-CM

## 2023-07-19 MED ORDER — LISDEXAMFETAMINE DIMESYLATE 70 MG PO CAPS
70.0000 mg | ORAL_CAPSULE | Freq: Every day | ORAL | 0 refills | Status: DC
Start: 2023-07-19 — End: 2023-07-22
  Filled 2023-07-19: qty 30, 30d supply, fill #0

## 2023-07-19 NOTE — Telephone Encounter (Signed)
Lf 11/18; lv 06/19

## 2023-07-20 ENCOUNTER — Other Ambulatory Visit: Payer: Self-pay | Admitting: Psychiatry

## 2023-07-20 DIAGNOSIS — F4001 Agoraphobia with panic disorder: Secondary | ICD-10-CM

## 2023-07-22 ENCOUNTER — Ambulatory Visit: Payer: BC Managed Care – PPO | Admitting: Psychiatry

## 2023-07-22 ENCOUNTER — Encounter: Payer: Self-pay | Admitting: Psychiatry

## 2023-07-22 DIAGNOSIS — Z8669 Personal history of other diseases of the nervous system and sense organs: Secondary | ICD-10-CM

## 2023-07-22 DIAGNOSIS — F900 Attention-deficit hyperactivity disorder, predominantly inattentive type: Secondary | ICD-10-CM | POA: Diagnosis not present

## 2023-07-22 DIAGNOSIS — F411 Generalized anxiety disorder: Secondary | ICD-10-CM

## 2023-07-22 DIAGNOSIS — F419 Anxiety disorder, unspecified: Secondary | ICD-10-CM

## 2023-07-22 DIAGNOSIS — F5105 Insomnia due to other mental disorder: Secondary | ICD-10-CM

## 2023-07-22 DIAGNOSIS — F4001 Agoraphobia with panic disorder: Secondary | ICD-10-CM | POA: Diagnosis not present

## 2023-07-22 DIAGNOSIS — F3342 Major depressive disorder, recurrent, in full remission: Secondary | ICD-10-CM

## 2023-07-22 MED ORDER — FLUOXETINE HCL 10 MG PO TABS: 5.0000 mg | ORAL_TABLET | Freq: Every day | ORAL | 0 refills | Status: DC

## 2023-07-22 MED ORDER — LISDEXAMFETAMINE DIMESYLATE 70 MG PO CAPS: 70.0000 mg | ORAL_CAPSULE | Freq: Every day | ORAL | 0 refills | Status: DC

## 2023-07-22 MED ORDER — RIZATRIPTAN BENZOATE 10 MG PO TABS: ORAL_TABLET | ORAL | 1 refills | Status: DC

## 2023-07-22 MED ORDER — BUPROPION HCL ER (XL) 150 MG PO TB24
450.0000 mg | ORAL_TABLET | Freq: Every day | ORAL | 0 refills | Status: DC
Start: 2023-07-22 — End: 2023-08-05

## 2023-07-22 MED ORDER — ALPRAZOLAM 0.5 MG PO TABS: 0.5000 mg | ORAL_TABLET | Freq: Every day | ORAL | 0 refills | Status: DC
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
## 2023-08-05 ENCOUNTER — Other Ambulatory Visit (HOSPITAL_COMMUNITY): Payer: Self-pay

## 2023-08-05 ENCOUNTER — Other Ambulatory Visit: Payer: Self-pay

## 2023-08-05 ENCOUNTER — Telehealth: Payer: Self-pay

## 2023-08-05 DIAGNOSIS — F5105 Insomnia due to other mental disorder: Secondary | ICD-10-CM

## 2023-08-05 DIAGNOSIS — Z8669 Personal history of other diseases of the nervous system and sense organs: Secondary | ICD-10-CM

## 2023-08-05 DIAGNOSIS — F900 Attention-deficit hyperactivity disorder, predominantly inattentive type: Secondary | ICD-10-CM

## 2023-08-05 DIAGNOSIS — F411 Generalized anxiety disorder: Secondary | ICD-10-CM

## 2023-08-05 DIAGNOSIS — F4001 Agoraphobia with panic disorder: Secondary | ICD-10-CM

## 2023-08-05 DIAGNOSIS — F419 Anxiety disorder, unspecified: Secondary | ICD-10-CM

## 2023-08-05 DIAGNOSIS — F3342 Major depressive disorder, recurrent, in full remission: Secondary | ICD-10-CM

## 2023-08-05 MED ORDER — RIZATRIPTAN BENZOATE 10 MG PO TABS
ORAL_TABLET | ORAL | 1 refills | Status: DC
  Filled 2023-08-05: qty 12, 30d supply, fill #0
  Filled 2023-09-15: qty 12, 30d supply, fill #1

## 2023-08-05 MED ORDER — BUPROPION HCL ER (XL) 150 MG PO TB24
450.0000 mg | ORAL_TABLET | Freq: Every day | ORAL | 0 refills | Status: DC
  Filled 2023-08-05: qty 270, 90d supply, fill #0

## 2023-08-05 MED ORDER — GABAPENTIN 100 MG PO CAPS
200.0000 mg | ORAL_CAPSULE | Freq: Two times a day (BID) | ORAL | 1 refills | Status: DC
  Filled 2023-08-05: qty 360, 90d supply, fill #0
  Filled 2023-11-15: qty 360, 90d supply, fill #1

## 2023-08-05 NOTE — Telephone Encounter (Signed)
 Marland Kitchen

## 2023-08-18 ENCOUNTER — Other Ambulatory Visit: Payer: Self-pay

## 2023-08-18 ENCOUNTER — Telehealth: Payer: Self-pay | Admitting: Psychiatry

## 2023-08-18 DIAGNOSIS — F900 Attention-deficit hyperactivity disorder, predominantly inattentive type: Secondary | ICD-10-CM

## 2023-08-18 MED ORDER — LISDEXAMFETAMINE DIMESYLATE 70 MG PO CAPS
70.0000 mg | ORAL_CAPSULE | Freq: Every day | ORAL | 0 refills | Status: DC
  Filled 2023-09-17: qty 30, 30d supply, fill #0

## 2023-08-18 MED ORDER — LISDEXAMFETAMINE DIMESYLATE 70 MG PO CAPS
70.0000 mg | ORAL_CAPSULE | Freq: Every day | ORAL | 0 refills | Status: DC
  Filled 2023-08-19: qty 30, 30d supply, fill #0

## 2023-08-18 NOTE — Telephone Encounter (Signed)
 PT lvm that she needs the generic vyvanse  sent to [facility] and cancel at [facility]. Also cancel February [facility] and [facility] to [facility]

## 2023-08-18 NOTE — Telephone Encounter (Signed)
 Canceled at [facility] pharmacy and pended to [facility] per request.

## 2023-08-19 ENCOUNTER — Other Ambulatory Visit (HOSPITAL_COMMUNITY): Payer: Self-pay

## 2023-08-19 ENCOUNTER — Other Ambulatory Visit: Payer: Self-pay

## 2023-08-19 NOTE — Telephone Encounter (Signed)
Pt called again and wants her xanax and her porextine 10 mg also sent to [facility]

## 2023-08-20 ENCOUNTER — Other Ambulatory Visit: Payer: Self-pay

## 2023-08-20 ENCOUNTER — Telehealth: Payer: Self-pay

## 2023-08-20 ENCOUNTER — Other Ambulatory Visit (HOSPITAL_COMMUNITY): Payer: Self-pay

## 2023-08-20 DIAGNOSIS — F5105 Insomnia due to other mental disorder: Secondary | ICD-10-CM

## 2023-08-20 DIAGNOSIS — F32A Depression, unspecified: Secondary | ICD-10-CM

## 2023-08-20 MED ORDER — ALPRAZOLAM 0.5 MG PO TABS
0.5000 mg | ORAL_TABLET | Freq: Every day | ORAL | 0 refills | Status: DC
  Filled 2023-08-20: qty 40, 20d supply, fill #0

## 2023-08-20 MED ORDER — PAROXETINE HCL 10 MG PO TABS
10.0000 mg | ORAL_TABLET | Freq: Every day | ORAL | 0 refills | Status: DC
  Filled 2023-08-20: qty 30, 30d supply, fill #0

## 2023-08-20 NOTE — Telephone Encounter (Signed)
Xanax 0.5 mg pended to [facility].

## 2023-08-20 NOTE — Telephone Encounter (Signed)
See addendum to attached message. Molly Brady has an appt on 10/27/23 with Dr. Jennelle Human. She is requesting a refill on her Xanax 0.5 mg called to:  [facility]   Phone: [phone]  Fax: [phone]

## 2023-09-06 ENCOUNTER — Telehealth: Payer: Self-pay | Admitting: Psychiatry

## 2023-09-06 NOTE — Telephone Encounter (Signed)
Molly Brady called at 9:15 to report that her prescription for Xanax is wrong.  It is for only #40.  Last one was for only #40 because you were trying to get the fill date the same as her other medications.  This one should have been for #60 as usual to keep it in line with the others. She requests this be corrected so she can keep her medications filling on the same date.

## 2023-09-06 NOTE — Telephone Encounter (Signed)
Spoke with pt. Her last dose is Wed Feb 5th. I let her know we can send in an rx to get her back on track with her other meds then. Sending in an rx for #78 and that would put her back to refilling her Xanax reg rx #60 back March 15.

## 2023-09-08 ENCOUNTER — Other Ambulatory Visit (HOSPITAL_COMMUNITY): Payer: Self-pay

## 2023-09-08 ENCOUNTER — Other Ambulatory Visit: Payer: Self-pay

## 2023-09-08 DIAGNOSIS — F5105 Insomnia due to other mental disorder: Secondary | ICD-10-CM

## 2023-09-08 DIAGNOSIS — F419 Anxiety disorder, unspecified: Secondary | ICD-10-CM

## 2023-09-08 MED ORDER — ALPRAZOLAM 0.5 MG PO TABS
0.5000 mg | ORAL_TABLET | Freq: Every day | ORAL | 1 refills | Status: DC
  Filled 2023-09-08: qty 78, 39d supply, fill #0
  Filled 2023-10-15: qty 78, 39d supply, fill #1

## 2023-09-08 NOTE — Telephone Encounter (Signed)
Pended xanax for #78 to [facility]

## 2023-09-15 ENCOUNTER — Other Ambulatory Visit (HOSPITAL_COMMUNITY): Payer: Self-pay

## 2023-09-17 ENCOUNTER — Other Ambulatory Visit (HOSPITAL_COMMUNITY): Payer: Self-pay

## 2023-09-17 ENCOUNTER — Other Ambulatory Visit: Payer: Self-pay

## 2023-09-22 ENCOUNTER — Other Ambulatory Visit: Payer: Self-pay | Admitting: Internal Medicine

## 2023-09-22 DIAGNOSIS — Z0000 Encounter for general adult medical examination without abnormal findings: Secondary | ICD-10-CM

## 2023-10-15 ENCOUNTER — Other Ambulatory Visit: Payer: Self-pay | Admitting: Psychiatry

## 2023-10-15 ENCOUNTER — Other Ambulatory Visit: Payer: Self-pay

## 2023-10-15 ENCOUNTER — Other Ambulatory Visit (HOSPITAL_COMMUNITY): Payer: Self-pay

## 2023-10-15 ENCOUNTER — Other Ambulatory Visit (HOSPITAL_BASED_OUTPATIENT_CLINIC_OR_DEPARTMENT_OTHER): Payer: Self-pay

## 2023-10-15 DIAGNOSIS — F900 Attention-deficit hyperactivity disorder, predominantly inattentive type: Secondary | ICD-10-CM

## 2023-10-15 MED ORDER — LISDEXAMFETAMINE DIMESYLATE 70 MG PO CAPS
70.0000 mg | ORAL_CAPSULE | Freq: Every day | ORAL | 0 refills | Status: DC
  Filled 2023-10-15: qty 30, 30d supply, fill #0

## 2023-10-15 MED ORDER — PAROXETINE HCL 10 MG PO TABS
10.0000 mg | ORAL_TABLET | Freq: Every day | ORAL | 0 refills | Status: DC
  Filled 2023-10-15: qty 30, 30d supply, fill #0

## 2023-10-18 ENCOUNTER — Other Ambulatory Visit: Payer: Self-pay | Admitting: Psychiatry

## 2023-10-18 ENCOUNTER — Other Ambulatory Visit (HOSPITAL_COMMUNITY): Payer: Self-pay

## 2023-10-18 DIAGNOSIS — Z8669 Personal history of other diseases of the nervous system and sense organs: Secondary | ICD-10-CM

## 2023-10-18 MED ORDER — RIZATRIPTAN BENZOATE 10 MG PO TABS
ORAL_TABLET | ORAL | 1 refills | Status: DC
  Filled 2023-10-18: qty 12, 30d supply, fill #0

## 2023-10-20 ENCOUNTER — Ambulatory Visit
Admission: RE | Admit: 2023-10-20 | Discharge: 2023-10-20 | Disposition: A | Discharge status: Home / Self Care | Payer: BC Managed Care – PPO | Source: Ambulatory Visit | Attending: Internal Medicine | Admitting: Internal Medicine

## 2023-10-20 ENCOUNTER — Other Ambulatory Visit (HOSPITAL_COMMUNITY): Payer: Self-pay

## 2023-10-20 DIAGNOSIS — Z0000 Encounter for general adult medical examination without abnormal findings: Secondary | ICD-10-CM

## 2023-10-20 DIAGNOSIS — Z1231 Encounter for screening mammogram for malignant neoplasm of breast: Secondary | ICD-10-CM | POA: Diagnosis not present

## 2023-10-20 MED ORDER — HEPATITIS B VAC RECOMB ADJ 20 MCG/0.5ML IM SOSY
0.5000 mL | PREFILLED_SYRINGE | Freq: Once | INTRAMUSCULAR | 0 refills | Status: AC
  Filled 2023-10-20: qty 0.5, 1d supply, fill #0

## 2023-10-27 ENCOUNTER — Ambulatory Visit: Payer: BC Managed Care – PPO | Admitting: Psychiatry

## 2023-11-04 ENCOUNTER — Encounter: Payer: Self-pay | Admitting: Psychiatry

## 2023-11-04 ENCOUNTER — Ambulatory Visit (INDEPENDENT_AMBULATORY_CARE_PROVIDER_SITE_OTHER): Payer: BC Managed Care – PPO | Admitting: Psychiatry

## 2023-11-04 ENCOUNTER — Other Ambulatory Visit (HOSPITAL_COMMUNITY): Payer: Self-pay

## 2023-11-04 DIAGNOSIS — F5105 Insomnia due to other mental disorder: Secondary | ICD-10-CM

## 2023-11-04 DIAGNOSIS — F4001 Agoraphobia with panic disorder: Secondary | ICD-10-CM | POA: Diagnosis not present

## 2023-11-04 DIAGNOSIS — F900 Attention-deficit hyperactivity disorder, predominantly inattentive type: Secondary | ICD-10-CM

## 2023-11-04 DIAGNOSIS — F419 Anxiety disorder, unspecified: Secondary | ICD-10-CM

## 2023-11-04 DIAGNOSIS — F3342 Major depressive disorder, recurrent, in full remission: Secondary | ICD-10-CM | POA: Diagnosis not present

## 2023-11-04 DIAGNOSIS — Z8669 Personal history of other diseases of the nervous system and sense organs: Secondary | ICD-10-CM

## 2023-11-04 DIAGNOSIS — F411 Generalized anxiety disorder: Secondary | ICD-10-CM

## 2023-11-04 MED ORDER — PAROXETINE HCL 10 MG PO TABS
10.0000 mg | ORAL_TABLET | Freq: Every day | ORAL | 0 refills | Status: DC
  Filled 2023-11-04 – 2023-11-15 (×2): qty 90, 90d supply, fill #0

## 2023-11-04 MED ORDER — BUPROPION HCL ER (XL) 150 MG PO TB24
450.0000 mg | ORAL_TABLET | Freq: Every day | ORAL | 1 refills | Status: DC
  Filled 2023-11-04: qty 270, 90d supply, fill #0
  Filled 2024-02-18: qty 270, 90d supply, fill #1

## 2023-11-04 MED ORDER — ALPRAZOLAM 0.5 MG PO TABS
0.5000 mg | ORAL_TABLET | Freq: Every day | ORAL | 5 refills | Status: DC
  Filled 2023-11-04 – 2023-11-24 (×4): qty 78, 39d supply, fill #0
  Filled 2023-12-31: qty 78, 39d supply, fill #1
  Filled 2024-02-07: qty 78, 39d supply, fill #2
  Filled 2024-03-19: qty 78, 39d supply, fill #3
  Filled 2024-04-24 – 2024-04-27 (×2): qty 78, 39d supply, fill #4

## 2023-11-04 MED ORDER — LISDEXAMFETAMINE DIMESYLATE 70 MG PO CAPS
70.0000 mg | ORAL_CAPSULE | Freq: Every day | ORAL | 0 refills | Status: DC
  Filled 2023-11-04 – 2023-11-16 (×3): qty 90, 90d supply, fill #0

## 2023-11-04 MED ORDER — RIZATRIPTAN BENZOATE 10 MG PO TABS
ORAL_TABLET | ORAL | 1 refills | Status: DC
  Filled 2023-11-04: qty 12, fill #0
  Filled 2023-11-15: qty 12, 30d supply, fill #0
  Filled 2023-12-14: qty 12, 30d supply, fill #1
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
## 2023-11-15 ENCOUNTER — Other Ambulatory Visit (HOSPITAL_COMMUNITY): Payer: Self-pay

## 2023-11-16 ENCOUNTER — Other Ambulatory Visit: Payer: Self-pay

## 2023-11-16 ENCOUNTER — Other Ambulatory Visit (HOSPITAL_COMMUNITY): Payer: Self-pay

## 2023-11-17 ENCOUNTER — Other Ambulatory Visit (HOSPITAL_COMMUNITY): Payer: Self-pay

## 2023-11-23 ENCOUNTER — Other Ambulatory Visit (HOSPITAL_COMMUNITY): Payer: Self-pay

## 2023-11-24 ENCOUNTER — Other Ambulatory Visit (HOSPITAL_COMMUNITY): Payer: Self-pay

## 2023-12-01 NOTE — Progress Notes (Incomplete)
 Annual Wellness Visit   Patient Care Team: Sylvan Evener, MD as PCP - General (Internal Medicine)  Visit Date: 12/01/23   No chief complaint on file.  Subjective:  Patient: Molly Brady, Female DOB: 24-Oct-1960, 63 y.o. MRN: [id number] There were no vitals filed for this visit. JALEXUS PUHL is a 63 y.o. Female who presents today for her Annual Wellness Visit. Patient has History of migraine headaches; Attention deficit disorder; Anxiety and depression; Elevated liver enzymes; GAD (generalized anxiety disorder); Panic; Ulcerative colitis (HCC); Hematochezia; Acute blood loss anemia; Generalized abdominal pain; and Abnormal finding on GI tract imaging on their problem list.  History of Hypertension treated with ***. {Blood Pressure:32095::"normotensive"} today at ***/***.   History of Hyperlipidemia treated with  *** . ***/***/*** Lipid Panel *** Lab Results  Component Value Date   CHOL 258 (H) 02/12/2020   HDL 78 02/12/2020   LDLCALC 161 (H) 02/12/2020   TRIG 84 02/12/2020   CHOLHDL 3.3 02/12/2020    History of Diabetes Mellitus, type *** treated with ***.  ***/***/*** HgbA1c ***.  No results found for: "HGBA1C"  History of *** treated with History of *** treated with History of *** treated with History of *** treated with History of *** treated with     Labs ***/***/*** CBC: ***    Component Value Date/Time   WBC 7.7 12/22/2022 0308   RBC 3.78 (L) 12/22/2022 0308   HGB 11.5 (L) 12/22/2022 0308   HCT 33.9 (L) 12/22/2022 0308   PLT 325 12/22/2022 0308   MCV 89.7 12/22/2022 0308   MCH 30.4 12/22/2022 0308   MCHC 33.9 12/22/2022 0308   RDW 12.7 12/22/2022 0308   LYMPHSABS 1,640 02/12/2020 1017   MONOABS 0.4 04/29/2019 1848   EOSABS 90 02/12/2020 1017   BASOSABS 40 02/12/2020 1017   CMP: ***     Component Value Date/Time   NA 137 12/22/2022 0308   K 3.5 12/22/2022 0308   CL 103 12/22/2022 0308   CO2 25 12/22/2022 0308   GLUCOSE 104 (H) 12/22/2022 0308    BUN <5 (L) 12/22/2022 0308   CREATININE 0.93 12/22/2022 0308   CREATININE 0.91 02/12/2020 1017   CALCIUM 8.6 (L) 12/22/2022 0308   PROT 6.9 12/19/2022 0939   ALBUMIN 3.5 12/19/2022 0939   AST 24 12/19/2022 0939   ALT 22 12/19/2022 0939   ALKPHOS 91 12/19/2022 0939   BILITOT 0.1 (L) 12/19/2022 0939   GFRNONAA >60 12/22/2022 0308   GFRNONAA 70 02/12/2020 1017   Lipid Panel: ***    Component Value Date/Time   CHOL 258 (H) 02/12/2020 1017   TRIG 84 02/12/2020 1017   HDL 78 02/12/2020 1017   CHOLHDL 3.3 02/12/2020 1017   LDLCALC 161 (H) 02/12/2020 1017   HgbA1c: *** No results found for: "HGBA1C" TSH: *** Lab Results  Component Value Date   TSH 1.36 02/12/2020    {Man or NUUVO:53664}  Vaccine Counseling: Due for {Vaccines:32291::"Flu"}; UTD on {Vaccines:32291::"Flu"} Past Medical History:  Diagnosis Date   ADHD    Anemia    none recent   Anxiety and depression    Elevated liver enzymes 2019   resolved 2019 related to ibuprofen  use   Endometrial polyp    bleeding/spotting x  2-3 months   Migraines    Medical/Surgical History Narrative:   has a past medical history of ADHD, Anemia, Anxiety and depression, Elevated liver enzymes (2019), Endometrial polyp, and Migraines.  Allergic/Intolerant to:  No Known Allergies *** - ***  *** - ***  *** - ***  *** - ***  *** - ***  *** - ***  *** - ***  *** - ***  Past Surgical History:  Procedure Laterality Date   ABDOMINAL HYSTERECTOMY     BIOPSY  12/22/2022   Procedure: BIOPSY;  Surgeon: Alvis Jourdain, MD;  Location: [facility] ENDOSCOPY;  Service: Gastroenterology;;   COLONOSCOPY WITH PROPOFOL  N/A 12/22/2022   Procedure: COLONOSCOPY WITH PROPOFOL ;  Surgeon: Alvis Jourdain, MD;  Location: [facility] ENDOSCOPY;  Service: Gastroenterology;  Laterality: N/A;   DILATATION & CURETTAGE/HYSTEROSCOPY WITH MYOSURE N/A 02/27/2020   Procedure: DILATATION & CURETTAGE/HYSTEROSCOPY WITH MYOSURE;  Surgeon: Zora Hires, MD;  Location: [facility];  Service: Gynecology;  Laterality: N/A;   LAPAROSCOPIC VAGINAL HYSTERECTOMY WITH SALPINGO OOPHORECTOMY     UMBILICAL HERNIA REPAIR  as baby   Other - Hx of: *** ; Surghx of: *** Family History  Problem Relation Age of Onset   Lung cancer Mother    Asthma Mother    Lung cancer Father    Asthma Sister    Breast cancer Maternal Aunt    Breast cancer Maternal Grandmother    Family History Narrative: family history includes Asthma in her mother and sister; Breast cancer in her maternal aunt and maternal grandmother; Lung cancer in her father and mother. Social History   Social History Narrative   Not on file    ROS  Objective:  Vitals: LMP 08/03/2014  Physical Exam Most Recent Functional Status Assessment:    12/19/2022    4:59 PM  In your present state of health, do you have any difficulty performing the following activities:  Doing errands, shopping? 0   Most Recent Fall Risk Assessment:    02/15/2023   12:41 PM  Fall Risk   Falls in the past year? 0  Number falls in past yr: 0  Injury with Fall? 0  Risk for fall due to : No Fall Risks  Follow up Falls evaluation completed   Most Recent Depression Screenings:    02/15/2023   12:41 PM 12/17/2022   12:07 PM  PHQ 2/9 Scores  PHQ - 2 Score 0 0  PHQ- 9 Score 0    Most Recent Cognitive Screening:     No data to display         Results:  Studies Obtained And Personally Reviewed By Me:  {Imaging, colonoscopy, mammogram, bone density scan, echocardiogram, heart cath, stress test, CT calcium score, etc.:32292}  Labs:     Component Value Date/Time   NA 137 12/22/2022 0308   K 3.5 12/22/2022 0308   CL 103 12/22/2022 0308   CO2 25 12/22/2022 0308   GLUCOSE 104 (H) 12/22/2022 0308   BUN <5 (L) 12/22/2022 0308   CREATININE 0.93 12/22/2022 0308   CREATININE 0.91 02/12/2020 1017   CALCIUM 8.6 (L) 12/22/2022 0308   PROT 6.9 12/19/2022 0939   ALBUMIN 3.5 12/19/2022 0939   AST 24 12/19/2022 0939    ALT 22 12/19/2022 0939   ALKPHOS 91 12/19/2022 0939   BILITOT 0.1 (L) 12/19/2022 0939   GFRNONAA >60 12/22/2022 0308   GFRNONAA 70 02/12/2020 1017   GFRAA 81 02/12/2020 1017    Lab Results  Component Value Date   WBC 7.7 12/22/2022   HGB 11.5 (L) 12/22/2022   HCT 33.9 (L) 12/22/2022   MCV 89.7 12/22/2022   PLT 325 12/22/2022   Lab Results  Component Value Date   CHOL 258 (H) 02/12/2020   HDL 78 02/12/2020   LDLCALC 161 (H) 02/12/2020   TRIG 84 02/12/2020   CHOLHDL 3.3 02/12/2020   No results found for: "HGBA1C"  Lab  Results  Component Value Date   TSH 1.36 02/12/2020    {PSA (Optional):32132} Assessment & Plan:  No orders of the defined types were placed in this encounter.  No orders of the defined types were placed in this encounter.  Other Labs Reviewed today:       Annual wellness visit done today including the all of the following: Reviewed patient's Family Medical History Reviewed and updated list of patient's medical providers Assessment of cognitive impairment was done Assessed patient's functional ability Established a written schedule for health screening services Health Risk Assessent Completed and Reviewed  Discussed health benefits of physical activity, and encouraged her to engage in regular exercise appropriate for her age and condition.    I,Emily Lagle,acting as a Neurosurgeon for Sylvan Evener, MD.,have documented all relevant documentation on the behalf of Sylvan Evener, MD,as directed by  Sylvan Evener, MD while in the presence of Sylvan Evener, MD.   ***

## 2023-12-02 DIAGNOSIS — C541 Malignant neoplasm of endometrium: Secondary | ICD-10-CM | POA: Diagnosis not present

## 2023-12-02 DIAGNOSIS — Z8542 Personal history of malignant neoplasm of other parts of uterus: Secondary | ICD-10-CM | POA: Diagnosis not present

## 2023-12-02 DIAGNOSIS — D071 Carcinoma in situ of vulva: Secondary | ICD-10-CM | POA: Diagnosis not present

## 2023-12-07 DIAGNOSIS — K51011 Ulcerative (chronic) pancolitis with rectal bleeding: Secondary | ICD-10-CM | POA: Diagnosis not present

## 2023-12-07 DIAGNOSIS — D509 Iron deficiency anemia, unspecified: Secondary | ICD-10-CM | POA: Diagnosis not present

## 2023-12-07 DIAGNOSIS — K5900 Constipation, unspecified: Secondary | ICD-10-CM | POA: Diagnosis not present

## 2023-12-07 DIAGNOSIS — Z860100 Personal history of colon polyps, unspecified: Secondary | ICD-10-CM | POA: Diagnosis not present

## 2023-12-16 ENCOUNTER — Other Ambulatory Visit: Payer: BC Managed Care – PPO

## 2023-12-23 ENCOUNTER — Encounter: Payer: BC Managed Care – PPO | Admitting: Internal Medicine

## 2023-12-31 ENCOUNTER — Other Ambulatory Visit: Payer: Self-pay

## 2024-01-13 ENCOUNTER — Other Ambulatory Visit (HOSPITAL_COMMUNITY): Payer: Self-pay

## 2024-01-13 ENCOUNTER — Other Ambulatory Visit: Payer: Self-pay | Admitting: Psychiatry

## 2024-01-13 DIAGNOSIS — Z8669 Personal history of other diseases of the nervous system and sense organs: Secondary | ICD-10-CM

## 2024-01-13 MED ORDER — RIZATRIPTAN BENZOATE 10 MG PO TABS
10.0000 mg | ORAL_TABLET | Freq: Every day | ORAL | 3 refills | Status: DC | PRN
  Filled 2024-01-13: qty 12, 21d supply, fill #0
  Filled 2024-02-18: qty 12, 21d supply, fill #1
  Filled 2024-04-24: qty 12, 21d supply, fill #2

## 2024-01-21 ENCOUNTER — Other Ambulatory Visit (HOSPITAL_COMMUNITY): Payer: Self-pay

## 2024-02-07 ENCOUNTER — Other Ambulatory Visit: Payer: Self-pay

## 2024-02-08 ENCOUNTER — Encounter: Admitting: Internal Medicine

## 2024-02-18 ENCOUNTER — Other Ambulatory Visit: Payer: Self-pay | Admitting: Psychiatry

## 2024-02-18 ENCOUNTER — Other Ambulatory Visit: Payer: Self-pay

## 2024-02-18 DIAGNOSIS — F4001 Agoraphobia with panic disorder: Secondary | ICD-10-CM

## 2024-02-18 DIAGNOSIS — F32A Depression, unspecified: Secondary | ICD-10-CM

## 2024-02-18 DIAGNOSIS — F900 Attention-deficit hyperactivity disorder, predominantly inattentive type: Secondary | ICD-10-CM

## 2024-02-18 DIAGNOSIS — F5105 Insomnia due to other mental disorder: Secondary | ICD-10-CM

## 2024-02-18 DIAGNOSIS — F3342 Major depressive disorder, recurrent, in full remission: Secondary | ICD-10-CM

## 2024-02-18 DIAGNOSIS — F411 Generalized anxiety disorder: Secondary | ICD-10-CM

## 2024-02-20 ENCOUNTER — Other Ambulatory Visit: Payer: Self-pay

## 2024-02-20 DIAGNOSIS — F900 Attention-deficit hyperactivity disorder, predominantly inattentive type: Secondary | ICD-10-CM

## 2024-02-20 MED ORDER — GABAPENTIN 100 MG PO CAPS
200.0000 mg | ORAL_CAPSULE | Freq: Two times a day (BID) | ORAL | 0 refills | Status: DC
  Filled 2024-02-20: qty 360, 90d supply, fill #0

## 2024-02-21 ENCOUNTER — Other Ambulatory Visit (HOSPITAL_COMMUNITY): Payer: Self-pay

## 2024-02-21 ENCOUNTER — Other Ambulatory Visit: Payer: Self-pay

## 2024-02-21 DIAGNOSIS — F411 Generalized anxiety disorder: Secondary | ICD-10-CM

## 2024-02-21 DIAGNOSIS — F3342 Major depressive disorder, recurrent, in full remission: Secondary | ICD-10-CM

## 2024-02-21 DIAGNOSIS — F4001 Agoraphobia with panic disorder: Secondary | ICD-10-CM

## 2024-02-21 MED ORDER — PAROXETINE HCL 10 MG PO TABS
10.0000 mg | ORAL_TABLET | Freq: Every day | ORAL | 0 refills | Status: DC
  Filled 2024-02-21: qty 90, 90d supply, fill #0

## 2024-02-21 MED ORDER — LISDEXAMFETAMINE DIMESYLATE 70 MG PO CAPS
70.0000 mg | ORAL_CAPSULE | Freq: Every day | ORAL | 0 refills | Status: DC
  Filled 2024-02-21: qty 90, 90d supply, fill #0

## 2024-02-22 ENCOUNTER — Other Ambulatory Visit (HOSPITAL_COMMUNITY): Payer: Self-pay

## 2024-02-29 ENCOUNTER — Other Ambulatory Visit (HOSPITAL_COMMUNITY): Payer: Self-pay

## 2024-03-13 ENCOUNTER — Other Ambulatory Visit (HOSPITAL_COMMUNITY): Payer: Self-pay

## 2024-03-14 ENCOUNTER — Other Ambulatory Visit (HOSPITAL_COMMUNITY): Payer: Self-pay

## 2024-03-14 ENCOUNTER — Other Ambulatory Visit: Payer: Self-pay

## 2024-03-14 MED ORDER — MESALAMINE 1.2 G PO TBEC
2.4000 g | DELAYED_RELEASE_TABLET | Freq: Two times a day (BID) | ORAL | 3 refills | Status: AC
  Filled 2024-03-14: qty 360, 90d supply, fill #0
  Filled 2024-06-12: qty 360, 90d supply, fill #1

## 2024-03-20 ENCOUNTER — Other Ambulatory Visit: Payer: Self-pay

## 2024-03-26 DIAGNOSIS — J0140 Acute pansinusitis, unspecified: Secondary | ICD-10-CM | POA: Diagnosis not present

## 2024-03-26 DIAGNOSIS — Z6823 Body mass index (BMI) 23.0-23.9, adult: Secondary | ICD-10-CM | POA: Diagnosis not present

## 2024-03-26 DIAGNOSIS — R051 Acute cough: Secondary | ICD-10-CM | POA: Diagnosis not present

## 2024-03-31 ENCOUNTER — Other Ambulatory Visit (HOSPITAL_COMMUNITY): Payer: Self-pay

## 2024-03-31 DIAGNOSIS — H2000 Unspecified acute and subacute iridocyclitis: Secondary | ICD-10-CM | POA: Diagnosis not present

## 2024-03-31 DIAGNOSIS — H5712 Ocular pain, left eye: Secondary | ICD-10-CM | POA: Diagnosis not present

## 2024-03-31 MED ORDER — PREDNISOLONE ACETATE 1 % OP SUSP
OPHTHALMIC | 0 refills | Status: AC
  Filled 2024-03-31: qty 10, 14d supply, fill #0

## 2024-03-31 MED ORDER — HEPATITIS B VAC RECOMB ADJ 20 MCG/0.5ML IM SOSY
0.5000 mL | PREFILLED_SYRINGE | Freq: Once | INTRAMUSCULAR | 0 refills | Status: AC
Start: 2024-03-31 — End: 2024-04-01
  Filled 2024-03-31: qty 0.5, 1d supply, fill #0

## 2024-04-04 DIAGNOSIS — H2000 Unspecified acute and subacute iridocyclitis: Secondary | ICD-10-CM | POA: Diagnosis not present

## 2024-04-24 ENCOUNTER — Other Ambulatory Visit: Payer: Self-pay

## 2024-04-24 ENCOUNTER — Other Ambulatory Visit (HOSPITAL_COMMUNITY): Payer: Self-pay

## 2024-04-28 ENCOUNTER — Other Ambulatory Visit (HOSPITAL_COMMUNITY): Payer: Self-pay

## 2024-05-01 ENCOUNTER — Ambulatory Visit: Admitting: Internal Medicine

## 2024-05-01 ENCOUNTER — Encounter: Payer: Self-pay | Admitting: Internal Medicine

## 2024-05-01 VITALS — BP 120/86 | HR 70 | Wt 136.0 lb

## 2024-05-01 DIAGNOSIS — Z8669 Personal history of other diseases of the nervous system and sense organs: Secondary | ICD-10-CM

## 2024-05-01 DIAGNOSIS — Z860101 Personal history of adenomatous and serrated colon polyps: Secondary | ICD-10-CM | POA: Diagnosis not present

## 2024-05-01 DIAGNOSIS — F988 Other specified behavioral and emotional disorders with onset usually occurring in childhood and adolescence: Secondary | ICD-10-CM

## 2024-05-01 DIAGNOSIS — F419 Anxiety disorder, unspecified: Secondary | ICD-10-CM | POA: Diagnosis not present

## 2024-05-01 DIAGNOSIS — K5100 Ulcerative (chronic) pancolitis without complications: Secondary | ICD-10-CM | POA: Diagnosis not present

## 2024-05-01 DIAGNOSIS — E7800 Pure hypercholesterolemia, unspecified: Secondary | ICD-10-CM

## 2024-05-01 DIAGNOSIS — F324 Major depressive disorder, single episode, in partial remission: Secondary | ICD-10-CM

## 2024-05-01 DIAGNOSIS — Z8542 Personal history of malignant neoplasm of other parts of uterus: Secondary | ICD-10-CM

## 2024-05-01 DIAGNOSIS — D071 Carcinoma in situ of vulva: Secondary | ICD-10-CM

## 2024-05-01 DIAGNOSIS — Z0000 Encounter for general adult medical examination without abnormal findings: Secondary | ICD-10-CM | POA: Diagnosis not present

## 2024-05-01 LAB — LIPID PANEL
Cholesterol: 228 mg/dL — ABNORMAL HIGH (ref <200)
HDL: 83 mg/dL (ref >=50)
LDL Cholesterol (Calc): 126 mg/dL — ABNORMAL HIGH
Non-HDL Cholesterol (Calc): 145 mg/dL — ABNORMAL HIGH (ref <130)
Total CHOL/HDL Ratio: 2.7 (calc) (ref <5.0)
Triglycerides: 88 mg/dL (ref <150)

## 2024-05-01 LAB — POCT URINALYSIS DIP (CLINITEK)
Bilirubin, UA: NEGATIVE
Blood, UA: NEGATIVE
Glucose, UA: NEGATIVE mg/dL
Ketones, POC UA: NEGATIVE mg/dL
Leukocytes, UA: NEGATIVE
Nitrite, UA: NEGATIVE
POC PROTEIN,UA: NEGATIVE
Spec Grav, UA: 1.01 (ref 1.010–1.025)
Urobilinogen, UA: 0.2 U/dL
pH, UA: 6 (ref 5.0–8.0)

## 2024-05-01 NOTE — Patient Instructions (Addendum)
 It was good to see you today. Mammogram is up to date as is colonoscopy. Continue current meds and follow up in one year.

## 2024-05-01 NOTE — Progress Notes (Signed)
 Annual Comprehensive Physical Exam    Patient Care Team: Perri Ronal PARAS, MD as PCP - General (Internal Medicine)  Visit Date: 05/01/24   Chief Complaint  Patient presents with   Annual Exam   Subjective:  Patient: Molly Brady, Female DOB: 1961-03-18, 63 y.o. MRN: [id number] Vitals:   05/01/24 1050  BP: 120/86   Molly Brady is a 63 y.o. Female who presents today for her Annual Comprehensive Physical Exam . Patient has History of migraine headaches; Attention deficit disorder; Anxiety and depression; Elevated liver enzymes; GAD (generalized anxiety disorder); Panic; Ulcerative colitis (HCC); Hematochezia; Acute blood loss anemia; Generalized abdominal pain; and Abnormal finding on GI tract imaging on their problem list.  She said is she doing  fairly well.  Continues to see Dr. Geoffry for depression and attention deficit disorder.   She says that her current medication is not effectively treating her Ulcerative Colitis. She says can't eat red meat, she only eats chicken, the only fruit she can eat is bananas and the only vegetable she can eat potatoes. Eating any other food will cause her abdominal discomfort.    History of Attention deficit disorder treated with Vyvanse  70 mg daily.   History of Anxiety and Depression treated with Xanax  0.5 mg 1-2 tablets at bedtime, Wellbutrin  XL 150 mg three times daily, Paxil  10 mg daily.   History of Migraine Headaches treated with Maxalt  10 mg as needed.   Hx of endometrial cancer followed at [facility]- seen yearly last visit May 2025 also has vulvar intraepithelial neoplasia III  Histrory of Ulcerative Collitis treated with Lialda  2.4 g twice daily.  Prefers not to start biologic medication  Labs 12/12/2023 Hematocrit 34.6 Potasium 3.2, Urea Nitrogen 4, Calcium 8.5, BUN/Crea ratio.     04/21/2024 Mammogram no mammographic evidence of malignancy. Repeat in one year.    12/22/2022 Colonoscopy Pancolitis. Inflammation was found from the anus to  the cecum. This was graded as Mayo Score 3 ( severe disease) . Biopsied. Chronic active colitis.  Negative for granuloma, dysplasia or malignancy  Last Gynecological exam Dec 02, 2023.   Vaccine counseling: Pneumonia, Influenza, Covid-19 vaccine declined.   Health Maintenance  Topic Date Due   COVID-19 Vaccine (3 - Moderna risk series) 05/17/2024 (Originally 12/06/2019)   Influenza Vaccine  10/31/2024 (Originally 03/03/2024)   Pneumococcal Vaccine: 50+ Years (1 of 1 - PCV) 05/01/2025 (Originally 02/02/2012)   Mammogram  10/19/2025   DTaP/Tdap/Td (2 - Td or Tdap) 01/30/2027   Colonoscopy  12/22/2027   Hepatitis B Vaccines 19-59 Average Risk  Completed   Zoster Vaccines- Shingrix  Completed   HPV VACCINES  Aged Out   Meningococcal B Vaccine  Aged Out   Hepatitis C Screening  Discontinued   HIV Screening  Discontinued     Review of Systems  Constitutional:  Negative for fever and malaise/fatigue.  HENT:  Negative for congestion.   Eyes:  Negative for blurred vision.  Respiratory:  Negative for cough and shortness of breath.   Cardiovascular:  Negative for chest pain, palpitations and leg swelling.  Gastrointestinal:  Negative for vomiting.  Musculoskeletal:  Negative for back pain.  Skin:  Negative for rash.  Neurological:  Negative for loss of consciousness and headaches.   Objective:  Vitals: body mass index is 23.34 kg/m. Today's Vitals   05/01/24 1050  BP: 120/86  Pulse: 70  SpO2: 96%  Weight: 136 lb (61.7 kg)  PainSc: 0-No pain   Physical Exam Vitals and nursing note  reviewed.  Constitutional:      General: She is not in acute distress.    Appearance: Normal appearance. She is not ill-appearing or toxic-appearing.  HENT:     Head: Normocephalic and atraumatic.     Right Ear: Hearing, tympanic membrane, ear canal and external ear normal.     Left Ear: Hearing, tympanic membrane, ear canal and external ear normal.     Mouth/Throat:     Pharynx: Oropharynx is clear.   Eyes:     Extraocular Movements: Extraocular movements intact.     Pupils: Pupils are equal, round, and reactive to light.  Neck:     Thyroid : No thyroid  mass, thyromegaly or thyroid  tenderness.     Vascular: No carotid bruit.  Cardiovascular:     Rate and Rhythm: Normal rate and regular rhythm. No extrasystoles are present.    Pulses:          Dorsalis pedis pulses are 2+ on the right side and 2+ on the left side.     Heart sounds: Normal heart sounds. No murmur heard.    No friction rub. No gallop.  Pulmonary:     Effort: Pulmonary effort is normal.     Breath sounds: Normal breath sounds. No decreased breath sounds, wheezing, rhonchi or rales.  Chest:     Chest wall: No mass.  Abdominal:     Palpations: Abdomen is soft. There is no hepatomegaly, splenomegaly or mass.     Tenderness: There is no abdominal tenderness.     Hernia: No hernia is present.  Genitourinary:    Comments: GYN exam deferred.  Musculoskeletal:     Cervical back: Normal range of motion.     Right lower leg: No edema.     Left lower leg: No edema.  Lymphadenopathy:     Cervical: No cervical adenopathy.     Upper Body:     Right upper body: No supraclavicular adenopathy.     Left upper body: No supraclavicular adenopathy.  Skin:    General: Skin is warm and dry.  Neurological:     General: No focal deficit present.     Mental Status: She is alert and oriented to person, place, and time. Mental status is at baseline.     Sensory: Sensation is intact.     Motor: Motor function is intact. No weakness.     Deep Tendon Reflexes: Reflexes are normal and symmetric.  Psychiatric:        Attention and Perception: Attention normal.        Mood and Affect: Mood normal.        Speech: Speech normal.        Behavior: Behavior normal.        Thought Content: Thought content normal.        Cognition and Memory: Cognition normal.        Judgment: Judgment normal.     Current Outpatient Medications   Medication Instructions   ALPRAZolam  (XANAX ) 0.5-1 mg, Oral, Daily at bedtime   buPROPion  (WELLBUTRIN  XL) 450 mg, Oral, Daily   gabapentin  (NEURONTIN ) 200 mg, Oral, 2 times daily   lisdexamfetamine (VYVANSE ) 70 mg, Oral, Daily   lisdexamfetamine (VYVANSE ) 70 mg, Oral, Daily   lisdexamfetamine (VYVANSE ) 70 mg, Oral, Daily   mesalamine  (LIALDA ) 2.4 g, Oral, 2 times daily   PARoxetine  (PAXIL ) 10 mg, Oral, Daily   rizatriptan  (MAXALT ) 10 MG tablet Take 1 tablet (10 mg) by mouth and may repeat in 2 hours as needed  as directed.   Tylenol  325-650 mg, Every 6 hours PRN   Past Medical History:  Diagnosis Date   ADHD    Anemia    none recent   Anxiety and depression    Elevated liver enzymes 2019   resolved 2019 related to ibuprofen  use   Endometrial polyp    bleeding/spotting x  2-3 months   Migraines    Medical/Surgical History Narrative:  Allergic/Intolerant to: No Known Allergies  Past Surgical History:  Procedure Laterality Date   ABDOMINAL HYSTERECTOMY     BIOPSY  12/22/2022   Procedure: BIOPSY;  Surgeon: Rollin Dover, MD;  Location: [facility] ENDOSCOPY;  Service: Gastroenterology;;   COLONOSCOPY WITH PROPOFOL  N/A 12/22/2022   Procedure: COLONOSCOPY WITH PROPOFOL ;  Surgeon: Rollin Dover, MD;  Location: [facility] ENDOSCOPY;  Service: Gastroenterology;  Laterality: N/A;   DILATATION & CURETTAGE/HYSTEROSCOPY WITH MYOSURE N/A 02/27/2020   Procedure: DILATATION & CURETTAGE/HYSTEROSCOPY WITH MYOSURE;  Surgeon: Linnell Devere BRAVO, MD;  Location: [facility];  Service: Gynecology;  Laterality: N/A;   LAPAROSCOPIC VAGINAL HYSTERECTOMY WITH SALPINGO OOPHORECTOMY     UMBILICAL HERNIA REPAIR  as baby   Family History  Problem Relation Age of Onset   Lung cancer Mother    Asthma Mother    Lung cancer Father    Asthma Sister    Breast cancer Maternal Aunt    Breast cancer Maternal Grandmother    Family History Narrative: grandmother and maternal aunt with hx GYN cancer.  Father died of  lung cancer at age 77.  Mother died of lung cancer at age 102.  2 sisters in good health.  Her daughter in her 30s is in good health.  1 brother in good health.   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 Does not smoke or consume alcohol.     Most Recent Health Risks Assessment:   Most Recent Social Determinants of Health (Including Hx of Tobacco, Alcohol, and Drug Use) SDOH Screenings   Food Insecurity: No Food Insecurity (12/19/2022)  Housing: Unknown (05/01/2024)  Transportation Needs: No Transportation Needs (12/19/2022)  Utilities: Not At Risk (12/19/2022)  Alcohol Screen: Low Risk  (05/01/2024)  Depression (PHQ2-9): Low Risk  (05/01/2024)  Tobacco Use: Low Risk  (05/01/2024)   Social History   Tobacco Use   Smoking status: Never   Smokeless tobacco: Never  Vaping Use   Vaping status: Never Used  Substance Use Topics   Alcohol use: Not Currently   Drug use: Not Currently   Most Recent Fall Risk Assessment:    02/15/2023   12:41 PM  Fall Risk   Falls in the past year? 0  Number falls in past yr: 0  Injury with Fall? 0  Risk for fall due to : No Fall Risks  Follow up Falls evaluation completed   Most Recent Anxiety/Depression Screenings:    05/01/2024   10:55 AM 02/15/2023   12:41 PM  PHQ 2/9 Scores  PHQ - 2 Score 0 0  PHQ- 9 Score 0 0    Results:  Studies Obtained And Personally Reviewed By Me:    04/21/2024 Mammogram no mammographic evidence of malignancy. Repeat in one year.    12/22/2022 Colonoscopy Pancolitis. Inflammation was found from the anus to the cecum. This was graded as Mayo Score 3 ( severe disease) . Biopsied. Chronic active colitis.  Negative for granuloma, dysplasia or malignancy.   Labs:  CBC w/ Differential Lab Results  Component Value Date   WBC 7.7  12/22/2022   RBC 3.78 (L) 12/22/2022   HGB 11.5 (L) 12/22/2022   HCT 33.9 (L) 12/22/2022   PLT 325 12/22/2022   MCV 89.7 12/22/2022   MCH 30.4  12/22/2022   MCHC 33.9 12/22/2022   RDW 12.7 12/22/2022   MPV 8.8 02/12/2020   LYMPHSABS 1,640 02/12/2020   MONOABS 0.4 04/29/2019   BASOSABS 40 02/12/2020    Comprehensive Metabolic Panel Lab Results  Component Value Date   NA 137 12/22/2022   K 3.5 12/22/2022   CL 103 12/22/2022   CO2 25 12/22/2022   GLUCOSE 104 (H) 12/22/2022   BUN <5 (L) 12/22/2022   CREATININE 0.93 12/22/2022   CALCIUM 8.6 (L) 12/22/2022   PROT 6.9 12/19/2022   ALBUMIN 3.5 12/19/2022   AST 24 12/19/2022   ALT 22 12/19/2022   ALKPHOS 91 12/19/2022   BILITOT 0.1 (L) 12/19/2022   GFRNONAA >60 12/22/2022   Lipid Panel  Lab Results  Component Value Date   CHOL 258 (H) 02/12/2020   HDL 78 02/12/2020   LDLCALC 161 (H) 02/12/2020   TRIG 84 02/12/2020   A1c No results found for: HGBA1C  TSH Lab Results  Component Value Date   TSH 1.36 02/12/2020    Assessment & Plan:   Orders Placed This Encounter  Procedures   Lipid panel   POCT URINALYSIS DIP (CLINITEK)   Attention Deficit Disorder: treated with Vyvanse  70 mg daily.   Anxiety and Depression: treated with Xanax  0.5 mg 1-2 tablets at bedtime, Wellbutrin  XL 150 mg three times daily, Paxil  10 mg daily. Seen by Dr. Geoffry  Migraine Headaches: treated with Maxalt  10 mg as needed.   Ulcerative Colitis: treated with Lialda  2.4 g twice daily.    04/21/2024 Mammogram no mammographic evidence of malignancy. Repeat in one year.    12/22/2022 Colonoscopy Pancolitis. Inflammation was found from the anus to the cecum. This was graded as Mayo Score 3 ( severe disease) . Biopsied. Chronic active colitis.  Negative for granuloma, dysplasia or malignancy  Last Gynecological exam Dec 02, 2023.   Hx Stage 1A  grade 1 endometrial carcinoma s/p laparoscopic hysterectomy, sentinel node sampling, BSO August 2021 seen at [facility] annually  Vaccine counseling: Pneumonia, Influenza, Covid-19 vaccine declined.   VIN III followed at [facility] Comprehensive Physical Exam done today including the all of the following: Reviewed patient's Family Medical History Reviewed patient's SDOH and reviewed tobacco, alcohol, and drug use.  Reviewed and updated list of patient's medical providers Assessment of cognitive impairment was done Assessed patient's functional ability Established a written schedule for health screening services Health Risk Assessent Completed and Reviewed  Discussed health benefits of physical activity, and encouraged her to engage in regular exercise appropriate for her age and condition.    I,Makayla C Reid,acting as a scribe for Ronal JINNY Hailstone, MD.,have documented all relevant documentation on the behalf of Ronal JINNY Hailstone, MD,as directed by  Ronal JINNY Hailstone, MD while in the presence of Ronal JINNY Hailstone, MD.   I, Ronal JINNY Hailstone, MD, have reviewed all documentation for and agree with the above Annual Wellness Visit documentation.  Ronal JINNY Hailstone, MD Internal Medicine 05/01/2024

## 2024-05-08 ENCOUNTER — Other Ambulatory Visit: Payer: Self-pay

## 2024-05-08 ENCOUNTER — Encounter: Payer: Self-pay | Admitting: Psychiatry

## 2024-05-08 ENCOUNTER — Other Ambulatory Visit (HOSPITAL_COMMUNITY): Payer: Self-pay

## 2024-05-08 ENCOUNTER — Ambulatory Visit (INDEPENDENT_AMBULATORY_CARE_PROVIDER_SITE_OTHER): Admitting: Psychiatry

## 2024-05-08 DIAGNOSIS — G43101 Migraine with aura, not intractable, with status migrainosus: Secondary | ICD-10-CM | POA: Diagnosis not present

## 2024-05-08 DIAGNOSIS — F5105 Insomnia due to other mental disorder: Secondary | ICD-10-CM

## 2024-05-08 DIAGNOSIS — F411 Generalized anxiety disorder: Secondary | ICD-10-CM | POA: Diagnosis not present

## 2024-05-08 DIAGNOSIS — Z8669 Personal history of other diseases of the nervous system and sense organs: Secondary | ICD-10-CM

## 2024-05-08 DIAGNOSIS — F4001 Agoraphobia with panic disorder: Secondary | ICD-10-CM

## 2024-05-08 DIAGNOSIS — F32A Depression, unspecified: Secondary | ICD-10-CM

## 2024-05-08 DIAGNOSIS — F3342 Major depressive disorder, recurrent, in full remission: Secondary | ICD-10-CM | POA: Diagnosis not present

## 2024-05-08 DIAGNOSIS — F900 Attention-deficit hyperactivity disorder, predominantly inattentive type: Secondary | ICD-10-CM

## 2024-05-08 DIAGNOSIS — F419 Anxiety disorder, unspecified: Secondary | ICD-10-CM

## 2024-05-08 MED ORDER — LISDEXAMFETAMINE DIMESYLATE 70 MG PO CAPS: 70.0000 mg | ORAL_CAPSULE | Freq: Every day | ORAL | 0 refills | Status: AC

## 2024-05-08 MED ORDER — ALPRAZOLAM 0.5 MG PO TABS
0.5000 mg | ORAL_TABLET | Freq: Every day | ORAL | 5 refills | Status: AC
  Filled 2024-05-08 – 2024-06-05 (×3): qty 78, 39d supply, fill #0
  Filled 2024-07-12: qty 78, 39d supply, fill #1
  Filled 2024-08-22: qty 78, 39d supply, fill #2

## 2024-05-08 MED ORDER — PAROXETINE HCL 10 MG PO TABS
10.0000 mg | ORAL_TABLET | Freq: Every day | ORAL | 0 refills | Status: DC
  Filled 2024-05-08: qty 90, 90d supply, fill #0

## 2024-05-08 MED ORDER — LISDEXAMFETAMINE DIMESYLATE 70 MG PO CAPS
70.0000 mg | ORAL_CAPSULE | Freq: Every day | ORAL | 0 refills | Status: DC
  Filled 2024-05-08 – 2024-05-22 (×3): qty 90, 90d supply, fill #0

## 2024-05-08 MED ORDER — RIZATRIPTAN BENZOATE 10 MG PO TABS
10.0000 mg | ORAL_TABLET | Freq: Every day | ORAL | 3 refills | Status: AC | PRN
  Filled 2024-05-08: qty 12, 6d supply, fill #0
  Filled 2024-05-16: qty 12, 18d supply, fill #0
  Filled 2024-05-22: qty 12, 21d supply, fill #0
  Filled 2024-07-05: qty 12, 21d supply, fill #1
  Filled 2024-07-31: qty 12, 30d supply, fill #2
  Filled 2024-08-02: qty 12, 21d supply, fill #2

## 2024-05-08 MED ORDER — GABAPENTIN 100 MG PO CAPS
200.0000 mg | ORAL_CAPSULE | Freq: Two times a day (BID) | ORAL | 4 refills | Status: AC
  Filled 2024-05-08 – 2024-07-12 (×2): qty 360, 90d supply, fill #0

## 2024-05-08 MED ORDER — BUPROPION HCL ER (XL) 150 MG PO TB24
450.0000 mg | ORAL_TABLET | Freq: Every day | ORAL | 1 refills | Status: AC
  Filled 2024-05-08: qty 270, 90d supply, fill #0
  Filled 2024-08-17: qty 270, 90d supply, fill #1
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
## 2024-05-16 ENCOUNTER — Other Ambulatory Visit (HOSPITAL_COMMUNITY): Payer: Self-pay

## 2024-05-22 ENCOUNTER — Other Ambulatory Visit (HOSPITAL_COMMUNITY): Payer: Self-pay

## 2024-06-01 ENCOUNTER — Other Ambulatory Visit (HOSPITAL_COMMUNITY): Payer: Self-pay

## 2024-06-05 ENCOUNTER — Other Ambulatory Visit (HOSPITAL_COMMUNITY): Payer: Self-pay

## 2024-07-05 ENCOUNTER — Other Ambulatory Visit (HOSPITAL_COMMUNITY): Payer: Self-pay

## 2024-07-12 ENCOUNTER — Other Ambulatory Visit (HOSPITAL_COMMUNITY): Payer: Self-pay

## 2024-07-12 ENCOUNTER — Other Ambulatory Visit: Payer: Self-pay

## 2024-07-31 ENCOUNTER — Other Ambulatory Visit (HOSPITAL_COMMUNITY): Payer: Self-pay

## 2024-08-01 ENCOUNTER — Other Ambulatory Visit (HOSPITAL_COMMUNITY): Payer: Self-pay

## 2024-08-04 ENCOUNTER — Other Ambulatory Visit (HOSPITAL_COMMUNITY): Payer: Self-pay

## 2024-08-17 ENCOUNTER — Other Ambulatory Visit (HOSPITAL_COMMUNITY): Payer: Self-pay

## 2024-08-17 ENCOUNTER — Encounter: Payer: Self-pay | Admitting: Pharmacist

## 2024-08-17 ENCOUNTER — Other Ambulatory Visit: Payer: Self-pay | Admitting: Psychiatry

## 2024-08-17 ENCOUNTER — Other Ambulatory Visit: Payer: Self-pay

## 2024-08-17 DIAGNOSIS — F900 Attention-deficit hyperactivity disorder, predominantly inattentive type: Secondary | ICD-10-CM

## 2024-08-17 DIAGNOSIS — F3342 Major depressive disorder, recurrent, in full remission: Secondary | ICD-10-CM

## 2024-08-17 DIAGNOSIS — F4001 Agoraphobia with panic disorder: Secondary | ICD-10-CM

## 2024-08-17 DIAGNOSIS — F411 Generalized anxiety disorder: Secondary | ICD-10-CM

## 2024-08-17 MED ORDER — PAROXETINE HCL 10 MG PO TABS
10.0000 mg | ORAL_TABLET | Freq: Every day | ORAL | 0 refills | Status: AC
  Filled 2024-08-17: qty 90, 90d supply, fill #0

## 2024-08-17 MED ORDER — LISDEXAMFETAMINE DIMESYLATE 70 MG PO CAPS
70.0000 mg | ORAL_CAPSULE | Freq: Every day | ORAL | 0 refills | Status: AC
  Filled 2024-08-17 – 2024-08-18 (×2): qty 90, 90d supply, fill #0

## 2024-08-18 ENCOUNTER — Other Ambulatory Visit (HOSPITAL_COMMUNITY): Payer: Self-pay

## 2024-08-22 ENCOUNTER — Other Ambulatory Visit: Payer: Self-pay

## 2024-11-07 ENCOUNTER — Telehealth: Admitting: Psychiatry
